# Patient Record
Sex: Male | Born: 1950
Health system: Southern US, Community
[De-identification: ages and names within clinical notes are randomized; demographics above are authoritative.]

## PROBLEM LIST (undated history)

## (undated) DIAGNOSIS — J449 Chronic obstructive pulmonary disease, unspecified: Secondary | ICD-10-CM

## (undated) DIAGNOSIS — I83812 Varicose veins of left lower extremities with pain: Secondary | ICD-10-CM

## (undated) DIAGNOSIS — R0989 Other specified symptoms and signs involving the circulatory and respiratory systems: Secondary | ICD-10-CM

## (undated) DIAGNOSIS — F17200 Nicotine dependence, unspecified, uncomplicated: Secondary | ICD-10-CM

## (undated) DIAGNOSIS — E7800 Pure hypercholesterolemia, unspecified: Secondary | ICD-10-CM

## (undated) DIAGNOSIS — Z1211 Encounter for screening for malignant neoplasm of colon: Secondary | ICD-10-CM

## (undated) DIAGNOSIS — H9190 Unspecified hearing loss, unspecified ear: Secondary | ICD-10-CM

## (undated) DIAGNOSIS — Z8619 Personal history of other infectious and parasitic diseases: Secondary | ICD-10-CM

## (undated) DIAGNOSIS — E039 Hypothyroidism, unspecified: Secondary | ICD-10-CM

## (undated) DIAGNOSIS — L219 Seborrheic dermatitis, unspecified: Secondary | ICD-10-CM

## (undated) HISTORY — DX: Pure hypercholesterolemia, unspecified: E78.00

## (undated) HISTORY — DX: Nicotine dependence, unspecified, uncomplicated: F17.200

## (undated) HISTORY — DX: Other specified symptoms and signs involving the circulatory and respiratory systems: R09.89

## (undated) HISTORY — PX: EXTERNAL EAR SURGERY: SHX627

## (undated) HISTORY — DX: Hypothyroidism, unspecified: E03.9

## (undated) HISTORY — DX: Unspecified hearing loss, unspecified ear: H91.90

## (undated) HISTORY — DX: Personal history of other infectious and parasitic diseases: Z86.19

## (undated) HISTORY — DX: Encounter for screening for malignant neoplasm of colon: Z12.11

## (undated) HISTORY — PX: TONSILLECTOMY: SUR1361

## (undated) HISTORY — DX: Varicose veins of left lower extremity with pain: I83.812

## (undated) HISTORY — DX: Chronic obstructive pulmonary disease, unspecified: J44.9

## (undated) HISTORY — DX: Seborrheic dermatitis, unspecified: L21.9

---

## 2002-11-29 ENCOUNTER — Encounter: Payer: Self-pay | Admitting: Emergency Medicine

## 2002-11-29 ENCOUNTER — Emergency Department (HOSPITAL_COMMUNITY): Admission: EM | Admit: 2002-11-29 | Discharge: 2002-11-30 | Payer: Self-pay | Admitting: Emergency Medicine

## 2004-05-17 ENCOUNTER — Ambulatory Visit: Payer: Self-pay | Admitting: Pulmonary Disease

## 2005-10-02 ENCOUNTER — Ambulatory Visit: Payer: Self-pay | Admitting: Pulmonary Disease

## 2006-11-28 ENCOUNTER — Ambulatory Visit: Payer: Self-pay | Admitting: Pulmonary Disease

## 2006-11-28 LAB — CONVERTED CEMR LAB
AST: 15 units/L (ref 0–37)
Albumin: 4.1 g/dL (ref 3.5–5.2)
Basophils Relative: 0.8 % (ref 0.0–1.0)
CO2: 30 meq/L (ref 19–32)
Chloride: 105 meq/L (ref 96–112)
Cholesterol: 198 mg/dL (ref 0–200)
Creatinine, Ser: 0.8 mg/dL (ref 0.4–1.5)
Eosinophils Relative: 1.8 % (ref 0.0–5.0)
Glucose, Bld: 97 mg/dL (ref 70–99)
HCT: 44.9 % (ref 39.0–52.0)
Hemoglobin: 15.3 g/dL (ref 13.0–17.0)
LDL Cholesterol: 147 mg/dL — ABNORMAL HIGH (ref 0–99)
MCHC: 34.2 g/dL (ref 30.0–36.0)
Monocytes Absolute: 0.6 10*3/uL (ref 0.2–0.7)
Neutrophils Relative %: 62.1 % (ref 43.0–77.0)
PSA: 0.71 ng/mL (ref 0.10–4.00)
Potassium: 4.6 meq/L (ref 3.5–5.1)
RBC: 5.12 M/uL (ref 4.22–5.81)
RDW: 12.1 % (ref 11.5–14.6)
Sodium: 140 meq/L (ref 135–145)
TSH: 3.04 microintl units/mL (ref 0.35–5.50)
Total Bilirubin: 0.8 mg/dL (ref 0.3–1.2)
Total CHOL/HDL Ratio: 4.8
Total Protein: 7.5 g/dL (ref 6.0–8.3)
VLDL: 9 mg/dL (ref 0–40)
WBC: 8.7 10*3/uL (ref 4.5–10.5)

## 2007-12-15 ENCOUNTER — Encounter: Payer: Self-pay | Admitting: Pulmonary Disease

## 2008-03-10 ENCOUNTER — Ambulatory Visit: Payer: Self-pay | Admitting: Pulmonary Disease

## 2008-03-10 DIAGNOSIS — F17200 Nicotine dependence, unspecified, uncomplicated: Secondary | ICD-10-CM | POA: Insufficient documentation

## 2008-03-10 DIAGNOSIS — E039 Hypothyroidism, unspecified: Secondary | ICD-10-CM | POA: Insufficient documentation

## 2008-03-10 DIAGNOSIS — J449 Chronic obstructive pulmonary disease, unspecified: Secondary | ICD-10-CM | POA: Insufficient documentation

## 2008-03-10 DIAGNOSIS — J4489 Other specified chronic obstructive pulmonary disease: Secondary | ICD-10-CM | POA: Insufficient documentation

## 2008-03-10 DIAGNOSIS — K4090 Unilateral inguinal hernia, without obstruction or gangrene, not specified as recurrent: Secondary | ICD-10-CM | POA: Insufficient documentation

## 2008-03-10 DIAGNOSIS — L219 Seborrheic dermatitis, unspecified: Secondary | ICD-10-CM | POA: Insufficient documentation

## 2008-03-12 DIAGNOSIS — H9190 Unspecified hearing loss, unspecified ear: Secondary | ICD-10-CM | POA: Insufficient documentation

## 2008-03-12 DIAGNOSIS — K029 Dental caries, unspecified: Secondary | ICD-10-CM | POA: Insufficient documentation

## 2008-03-12 DIAGNOSIS — I868 Varicose veins of other specified sites: Secondary | ICD-10-CM | POA: Insufficient documentation

## 2008-03-12 DIAGNOSIS — E7800 Pure hypercholesterolemia, unspecified: Secondary | ICD-10-CM | POA: Insufficient documentation

## 2008-03-15 ENCOUNTER — Ambulatory Visit: Payer: Self-pay | Admitting: Pulmonary Disease

## 2008-03-17 LAB — CONVERTED CEMR LAB
ALT: 20 units/L (ref 0–53)
AST: 20 units/L (ref 0–37)
Albumin: 4.2 g/dL (ref 3.5–5.2)
Alkaline Phosphatase: 102 units/L (ref 39–117)
BUN: 10 mg/dL (ref 6–23)
Basophils Relative: 0.3 % (ref 0.0–3.0)
CO2: 30 meq/L (ref 19–32)
Chloride: 100 meq/L (ref 96–112)
Creatinine, Ser: 0.9 mg/dL (ref 0.4–1.5)
Eosinophils Relative: 1.8 % (ref 0.0–5.0)
Glucose, Bld: 83 mg/dL (ref 70–99)
HCT: 47.1 % (ref 39.0–52.0)
HDL: 38.3 mg/dL — ABNORMAL LOW (ref >39.0)
Hemoglobin, Urine: NEGATIVE
Monocytes Relative: 7.3 % (ref 3.0–12.0)
Neutrophils Relative %: 74 % (ref 43.0–77.0)
Nitrite: NEGATIVE
Platelets: 202 10*3/uL (ref 150–400)
Potassium: 4.6 meq/L (ref 3.5–5.1)
RBC: 5.27 M/uL (ref 4.22–5.81)
Specific Gravity, Urine: 1.01 (ref 1.000–1.03)
Total Protein, Urine: NEGATIVE mg/dL
Total Protein: 7.5 g/dL (ref 6.0–8.3)
Urine Glucose: NEGATIVE mg/dL
Urobilinogen, UA: 0.2 (ref 0.0–1.0)
WBC: 13.5 10*3/uL — ABNORMAL HIGH (ref 4.5–10.5)

## 2009-06-29 ENCOUNTER — Telehealth: Payer: Self-pay | Admitting: Pulmonary Disease

## 2009-07-17 ENCOUNTER — Telehealth: Payer: Self-pay | Admitting: Pulmonary Disease

## 2009-09-12 ENCOUNTER — Ambulatory Visit: Payer: Self-pay | Admitting: Pulmonary Disease

## 2009-09-12 LAB — CONVERTED CEMR LAB
ALT: 13 units/L (ref 0–53)
Alkaline Phosphatase: 95 units/L (ref 39–117)
Basophils Relative: 0.9 % (ref 0.0–3.0)
Bilirubin Urine: NEGATIVE
Bilirubin, Direct: 0.1 mg/dL (ref 0.0–0.3)
Chloride: 101 meq/L (ref 96–112)
Cholesterol: 205 mg/dL — ABNORMAL HIGH (ref 0–200)
Creatinine, Ser: 0.9 mg/dL (ref 0.4–1.5)
Eosinophils Absolute: 0.2 10*3/uL (ref 0.0–0.7)
Ketones, ur: NEGATIVE mg/dL
MCHC: 33.5 g/dL (ref 30.0–36.0)
MCV: 91.3 fL (ref 78.0–100.0)
Monocytes Absolute: 0.7 10*3/uL (ref 0.1–1.0)
Neutro Abs: 6.5 10*3/uL (ref 1.4–7.7)
Neutrophils Relative %: 66.8 % (ref 43.0–77.0)
PSA: 0.92 ng/mL (ref 0.10–4.00)
Potassium: 3.8 meq/L (ref 3.5–5.1)
RBC: 5.03 M/uL (ref 4.22–5.81)
Sodium: 141 meq/L (ref 135–145)
Total Bilirubin: 0.6 mg/dL (ref 0.3–1.2)
Total CHOL/HDL Ratio: 4
Total Protein, Urine: NEGATIVE mg/dL
VLDL: 15.2 mg/dL (ref 0.0–40.0)
pH: 6.5 (ref 5.0–8.0)

## 2010-07-17 NOTE — Assessment & Plan Note (Signed)
Summary: rov/jd   CC:  18 month ROV & CPX....  History of Present Illness: 60 y/o WM here for a follow up visit and CPX...    ~  Sep09:  he notes that he has been having dental problems and getting some work done- he requests an antibiotic to keep on hand "just in case"... he is allergic to Amox w/ rash- OK Biaxin per request.   ~  September 12, 2009:  here for CPX- doing well in general but he is disappointed in himself for continued smoking  ~1ppd;  never filled the Chantix after reading the Pharm information "that stuff can kill you";  want's to try "the patch"...  we prev rec Statin Rx for his Chol but he read that Pharm hand-out too and decided against med Rx due to potentialside effects (of course I reminded him of the known "side effects" of elevated chol & coninued smoking)... he never scheduled his needed colon screen either... he did get the much needed dental work...   Current Problem List:  PHYSICAL EXAMINATION (ICD-V70.0) - he has never had a routine screening colonoscopy and we will refer him to GI for this screening procedure...  DENTAL CARIES (ICD-521.00) - as above... he had extensive dental work done & he is a much better situation now...  UNSPECIFIED HEARING LOSS (ICD-389.9) - hx of nail gun injury behind left ear that left him deaf in that ear...  CIGARETTE SMOKER (ICD-305.1) - still smoking 1ppd... we discussed strategies for smoking cessation.  COPD (ICD-496) - he denies symptoms- denies cough, sputum, hemoptysis, worsening dyspnea,  wheezing, chest pains, snoring, daytime hypersomnolence, etc... he also declined prev baseline PFT's... CXR's have shown mild peribronchial thickening/ COPD...   ~  f/u CXR 3/11 showed clear lungs, NAD.Marland KitchenMarland Kitchen  VARICOSE VEIN (ICD-456.8) - he has VV on left leg and wears support hose...  HYPERCHOLESTEROLEMIA (ICD-272.0) - on diet alone...  ~  FLP 6/08 showed TChol 198, TG 47, HDL 42, LDL 147... rec> better diet, consider low dose Statin.  ~  FLP  9/09 showed TChol 227, TG 94, HDL 38, LDL 181... rec> start Prav40 ($4 list)- he never tried it!  ~  FLP 3/11 showed TChol 205, TG 76, HDL 46, LDL 152... he prefers diet Rx, no meds.  HYPOTHYROIDISM (ICD-244.9) - stable on SYNTHROID .149mcg/d...   ~  labs 6/08 showed TSH= 3.04  ~  labs 9/09 showed TSH= 3.16  ~  labs 3/11 showed TSH= 4.09  INGUINAL HERNIA (ICD-550.90) - he has bilat inguinal hernias (R>L) & wears a truss... surg eval 2007 by [facility] who rec surgical repair but pt has so far declined intervention and prefers to continue wearing the truss... hernias are easily reducible...  ~  3/11:  hernias w/o change- he will consider surg at [facility].  DERMATITIS, SEBORRHEIC (ICD-690.10) - he uses LOTRISONE cream Prn...   Allergies: 1)  ! Penicillin  Comments:  Nurse/Medical Assistant: The patient's medications and allergies were reviewed with the patient and were updated in the Medication and Allergy Lists.  Past History:  Past Medical History:  DENTAL CARIES (ICD-521.00) UNSPECIFIED HEARING LOSS (ICD-389.9) CIGARETTE SMOKER (ICD-305.1) COPD (ICD-496) VARICOSE VEIN (ICD-456.8) HYPERCHOLESTEROLEMIA (ICD-272.0) HYPOTHYROIDISM (ICD-244.9) INGUINAL HERNIA (ICD-550.90) DERMATITIS, SEBORRHEIC (ICD-690.10)  Family History: Reviewed history from 03/10/2008 and no changes required. Father alive age 81 w/ thyroid condition and bipolar illness Mother alive age 59 w/ CLL 2 Sibling:  1 Bro w/ cerebral aneurysm surg,  1 Sis a/w  Social History: Reviewed history from  03/10/2008 and no changes required. 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 no Etoh  Review of Systems  The patient denies fever, chills, sweats, anorexia, fatigue, weakness, malaise, weight loss, sleep disorder, blurring, diplopia, eye irritation, eye discharge, vision loss, eye pain, photophobia, earache, ear discharge, tinnitus, decreased  hearing, nasal congestion, nosebleeds, sore throat, hoarseness, chest pain, palpitations, syncope, dyspnea on exertion, orthopnea, PND, peripheral edema, cough, dyspnea at rest, excessive sputum, hemoptysis, wheezing, pleurisy, nausea, vomiting, diarrhea, constipation, change in bowel habits, abdominal pain, melena, hematochezia, jaundice, gas/bloating, indigestion/heartburn, dysphagia, odynophagia, dysuria, hematuria, urinary frequency, urinary hesitancy, nocturia, incontinence, back pain, joint pain, joint swelling, muscle cramps, muscle weakness, stiffness, arthritis, sciatica, restless legs, leg pain at night, leg pain with exertion, rash, itching, dryness, suspicious lesions, paralysis, paresthesias, seizures, tremors, vertigo, transient blindness, frequent falls, frequent headaches, difficulty walking, depression, anxiety, memory loss, confusion, cold intolerance, heat intolerance, polydipsia, polyphagia, polyuria, unusual weight change, abnormal bruising, bleeding, enlarged lymph nodes, urticaria, allergic rash, hay fever, and recurrent infections.    Vital Signs:  Patient profile:   60 year old male Height:      73 inches Weight:      185 pounds BMI:     24.50 O2 Sat:      99 % on Room air Temp:     97.0 degrees F oral Pulse rate:   58 / minute BP sitting:   134 / 86  (left arm) Cuff size:   regular  Vitals Entered By: Randell Loop CMA (September 12, 2009 11:36 AM)  O2 Sat at Rest %:  99 O2 Flow:  Room air CC: 18 month ROV & CPX... Is Patient Diabetic? No Pain Assessment Patient in pain? no      Comments meds updated today   Physical Exam  Additional Exam:  WD, WN, 60 y/o WM in NAD... GENERAL:  Alert & oriented; pleasant & cooperative... HEENT:  [address]/AT, EOM-wnl, PERRLA, Fundi-benign, EACs-clear, TMs-wnl, NOSE-clear, THROAT-clear & wnl, TEETH-poor repair. NECK:  Supple w/ fairROM; no JVD; normal carotid impulses w/o bruits; no thyromegaly or nodules palpated; no  lymphadenopathy. CHEST:  Clear to P & A; without wheezes/ rales/ or rhonchi heard... HEART:  Regular Rhythm; without murmurs/ rubs/ or gallops detected... ABDOMEN:  Soft & nontender; normal bowel sounds; no organomegaly or masses palpated... RECTAL:  Neg - prostate 2+ & nontender w/o nodules; stool hematest neg. EXT: without deformities or arthritic changes; +varicose veins on left leg/ +venous insuffic/ no edema. NEURO:  CN's intact; motor testing normal; sensory testing normal; gait normal & balance OK. DERM:  No lesions noted; no rash etc...    CXR  Procedure date:  09/12/2009  Findings:      CHEST - 2 VIEW Comparison: Chest x-ray of 03/10/2008   Findings: The lungs are clear and slightly hyperaerated. Mediastinal contours are stable.  The heart is within upper limits of normal.  No acute bony abnormality is seen.   IMPRESSION: Stable chest x-ray with slight hyperaeration.  No active lung disease.   Read By:  Juline Patch,  M.D.    MISC. Report  Procedure date:  09/12/2009  Findings:      Lipid Panel (LIPID)   Cholesterol          [H]  205 mg/dL                   0-454   Triglycerides  76.0 mg/dL                  0.4-540.9   HDL                       81.19 mg/dL                 >14.78 Cholesterol LDL - Direct                             151.8 mg/dL  BMP (METABOL)   Sodium                    141 mEq/L                   135-145   Potassium                 3.8 mEq/L                   3.5-5.1   Chloride                  101 mEq/L                   96-112   Carbon Dioxide            32 mEq/L                    19-32   Glucose                   86 mg/dL                    29-56   BUN                       10 mg/dL                    2-13   Creatinine                0.9 mg/dL                   0.8-6.5   Calcium                   9.3 mg/dL                   7.8-46.9   GFR                       92.39 mL/min                >60  Hepatic/Liver Function Panel  (HEPATIC)   Total Bilirubin           0.6 mg/dL                   6.2-9.5   Direct Bilirubin          0.1 mg/dL                   2.8-4.1   Alkaline Phosphatase      95 U/L                      39-117   AST  16 U/L                      0-37   ALT                       13 U/L                      0-53   Total Protein             8.1 g/dL                    1.6-1.0   Albumin                   4.2 g/dL                    9.6-0.4  Comments:      CBC Platelet w/Diff (CBCD)   White Cell Count          9.7 K/uL                    4.5-10.5   Red Cell Count            5.03 Mil/uL                 4.22-5.81   Hemoglobin                15.4 g/dL                   54.0-98.1   Hematocrit                45.9 %                      39.0-52.0   MCV                       91.3 fl                     78.0-100.0   Platelet Count            198.0 K/uL                  150.0-400.0   Neutrophil %              66.8 %                      43.0-77.0   Lymphocyte %              23.1 %                      12.0-46.0   Monocyte %                7.3 %                       3.0-12.0   Eosinophils%              1.9 %                       0.0-5.0   Basophils %               0.9 %  0.0-3.0  TSH (TSH)   FastTSH                   4.09 uIU/mL                 0.35-5.50   Prostate Specific Antigen (PSA)   PSA-Hyb                   0.92 ng/mL                  0.10-4.00   UDip w/Micro (URINE)   Color                     LT. YELLOW   Clarity                   CLEAR                       Clear   Specific Gravity          <=1.005                     1.000 - 1.030   Urine Ph                  6.5                         5.0-8.0   Protein                   NEGATIVE                    Negative   Urine Glucose             NEGATIVE                    Negative   Ketones                   NEGATIVE                    Negative   Urine Bilirubin           NEGATIVE                     Negative   Blood                     NEGATIVE                    Negative   Urobilinogen              0.2                         0.0 - 1.0   Leukocyte Esterace        NEGATIVE                    Negative   Nitrite                   NEGATIVE                    Negative   Urine Epith               Rare(0-4/hpf)    Impression & Recommendations:  Problem # 1:  PHYSICAL  EXAMINATION (ICD-V70.0) He desperately needs to quit smoking, needs to improve his Chol, and needs screening colon... discussed in detail w/ pt... Orders: T-2 View CXR (71020TC) TLB-Lipid Panel (80061-LIPID) TLB-BMP (Basic Metabolic Panel-BMET) (80048-METABOL) TLB-Hepatic/Liver Function Pnl (80076-HEPATIC) TLB-CBC Platelet - w/Differential (85025-CBCD) TLB-TSH (Thyroid Stimulating Hormone) (84443-TSH) TLB-PSA (Prostate Specific Antigen) (84153-PSA) TLB-Udip w/ Micro (81001-URINE) Gastroenterology Referral (GI)  Problem # 2:  COPD (ICD-496) He is asymptomatic but needs to quit smoking... discussed smoking cessation strategies including cessation programs, counselling, nicotine replacement, and Chantix receptor blockade... the pt is not interested at this time but we left the door open should she like to reconsider at any time.   Problem # 3:  HYPERCHOLESTEROLEMIA (ICD-272.0) His LDL is sl better than 2009 but stiil >100 and needs improvement... he prefers diet + exercise & no new meds. The following medications were removed from the medication list:    Pravastatin Sodium 40 Mg Tabs (Pravastatin sodium) .[name]... Take one tablet by mouth at bedtime  Problem # 4:  HYPOTHYROIDISM (ICD-244.9) Stable on this dose of Synthroid... His updated medication list for this problem includes:    Levoxyl 137 Mcg Tabs (Levothyroxine sodium) .[name]... Take 1 tablet by mouth once a day  Problem # 5:  INGUINAL HERNIA (ICD-550.90) He has bilat inguinal hernias & in need of elective repair... offered to set up appt but he wanted to do this on his  own...  Problem # 6:  OTHER MEDICAL PROBLEMS AS NOTED>>>  Complete Medication List: 1)  Levoxyl 137 Mcg Tabs (Levothyroxine sodium) .... Take 1 tablet by mouth once a day 2)  Clotrimazole-betamethasone 1-0.05 % Crea (Clotrimazole-betamethasone) .... Apply to rash two times a day... 3)  Nicoderm Cq 21 Mg/24hr Pt24 (Nicotine) .... Apply one patch daily as directed...  Other Orders: Prescription Created Electronically [phone])  Patient Instructions: 1)  Today we updated your med list- see below.... 2)  We refilled your meds today... 3)  We also did your follow up CXR & fasting blood work... please call the "phone tree" in a few days for your lab results.Marland KitchenMarland Kitchen 4)  Samaj, you need to quit smoking!!! 5)  We will make a referral to our Gastroenterology Dept regarding the need for a screening colonoscopy... 6)  Call for any problems.Marland KitchenMarland Kitchen 7)  Please schedule a follow-up appointment in 1 year. Prescriptions: NICODERM CQ 21 MG/24HR PT24 (NICOTINE) apply one patch daily as directed...  #14 x 0   Entered and Authorized by:   Michele Mcalpine MD   Signed by:   Michele Mcalpine MD on 09/12/2009   Method used:   Print then Give to Patient   RxID:   [id number] CLOTRIMAZOLE-BETAMETHASONE 1-0.05 % CREA (CLOTRIMAZOLE-BETAMETHASONE) apply to rash two times a day...  #1 tube x prn   Entered and Authorized by:   Michele Mcalpine MD   Signed by:   Michele Mcalpine MD on 09/12/2009   Method used:   Print then Give to Patient   RxID:   [id number] LEVOXYL 137 MCG TABS (LEVOTHYROXINE SODIUM) Take 1 tablet by mouth once a day  #90 x prn   Entered and Authorized by:   Michele Mcalpine MD   Signed by:   Michele Mcalpine MD on 09/12/2009   Method used:   Print then Give to Patient   RxID:   [id number]

## 2010-07-17 NOTE — Progress Notes (Signed)
Summary: rx  Phone Note Call from Patient Call back at [phone]   Caller: Patient Call For: Joseph Meyers Reason for Call: Talk to Nurse Summary of Call: cold x 1 week,  chest congestion,  wants to get this knocked out   Can he get something called in for this? [facility] [place] Initial call taken by: Eugene Gavia,  June 29, 2009 2:20 PM  Follow-up for Phone Call        per SN---ok for pt to have zpak #1  and use mucinex max 1 by mouth two times a day with plenty of fluids.   [facility] into [facility] [place]  [phone].   pt is aware of these med recs per SN. Randell Loop CMA  June 29, 2009 4:16 PM

## 2010-07-17 NOTE — Progress Notes (Signed)
Summary: refill  Phone Note Call from Patient Call back at [phone]   Caller: Patient Call For: Mataeo Ingwersen Summary of Call: Pt needs refill on levoxyl, he has an appt on 3/29. Initial call taken by: Darletta Moll,  July 17, 2009 3:42 PM  Follow-up for Phone Call        meds called into pharmacy---[phone]  [facility] [place], [place] per pt----sent in #60 to last pt until his appt in march--pt is aware--the pharmcy listed in the refill request is not the correct pharmacy Randell Loop CMA  July 17, 2009 3:47 PM     Prescriptions: LEVOXYL 137 MCG TABS (LEVOTHYROXINE SODIUM) Take 1 tablet by mouth once a day  #60 x 0   Entered by:   Randell Loop CMA   Authorized by:   Michele Mcalpine MD   Signed by:   Randell Loop CMA on 07/17/2009   Method used:   Telephoned to ...       [facility] (retail)       [address]       [place], [place]  [address]       Ph: [phone]       Fax: [phone]   RxID:   [id number]

## 2010-11-07 ENCOUNTER — Telehealth: Payer: Self-pay | Admitting: Pulmonary Disease

## 2010-11-07 MED ORDER — LEVOTHYROXINE SODIUM 137 MCG PO TABS: 137.0000 ug | ORAL_TABLET | Freq: Every day | ORAL | Status: DC

## 2010-11-07 NOTE — Telephone Encounter (Signed)
Called, spoke with pt.  He is reqeusting levoxyl rx for 30 days supply to [facility] in [place].  Aware rx sent but instructed to keep scheduled appt in June for additional rxs.  Pt verbalized understanding of this.

## 2010-12-10 ENCOUNTER — Encounter: Payer: Self-pay | Admitting: Pulmonary Disease

## 2010-12-11 ENCOUNTER — Encounter: Payer: Self-pay | Admitting: Pulmonary Disease

## 2010-12-11 ENCOUNTER — Ambulatory Visit (INDEPENDENT_AMBULATORY_CARE_PROVIDER_SITE_OTHER): Payer: Managed Care, Other (non HMO) | Admitting: Pulmonary Disease

## 2010-12-11 VITALS — BP 128/84 | HR 82 | Temp 98.3°F | Resp 16 | Ht 73.0 in | Wt 185.0 lb

## 2010-12-11 DIAGNOSIS — F17200 Nicotine dependence, unspecified, uncomplicated: Secondary | ICD-10-CM

## 2010-12-11 DIAGNOSIS — E039 Hypothyroidism, unspecified: Secondary | ICD-10-CM

## 2010-12-11 DIAGNOSIS — I868 Varicose veins of other specified sites: Secondary | ICD-10-CM

## 2010-12-11 DIAGNOSIS — J449 Chronic obstructive pulmonary disease, unspecified: Secondary | ICD-10-CM

## 2010-12-11 DIAGNOSIS — E7800 Pure hypercholesterolemia, unspecified: Secondary | ICD-10-CM

## 2010-12-11 DIAGNOSIS — K4090 Unilateral inguinal hernia, without obstruction or gangrene, not specified as recurrent: Secondary | ICD-10-CM

## 2010-12-11 DIAGNOSIS — Z0000 Encounter for general adult medical examination without abnormal findings: Secondary | ICD-10-CM

## 2010-12-11 MED ORDER — LEVOTHYROXINE SODIUM 137 MCG PO TABS: 137.0000 ug | ORAL_TABLET | Freq: Every day | ORAL | Status: DC

## 2010-12-11 NOTE — Patient Instructions (Signed)
Today we updated your med list in our EPIC system>    We refilled your Synthroid Rx...  Please return to our lab one morning soon for your follow up fasting blood work...    Then please call the PHONE TREE in a few days for your results...    Dial [id number] & when prompted enter your patient number followed by the # symbol...    Your patient number is:  [id number]#  Joseph Meyers, you need to quit the smoking! And you need to set up a routine colonoscopy w/ Dr. Lina Sar...  Call for any questions...  Let's plan a follow up appt in one year, sooner if needed for problems.Marland KitchenMarland Kitchen

## 2010-12-11 NOTE — Progress Notes (Signed)
Subjective:    Patient ID: Joseph Meyers, male    DOB: Mar 07, 1951, 60 y.o.   MRN: [id number]  HPI 59 y/o WM here for a follow up visit and CPX...   ~  Sep09:  he notes that he has been having dental problems and getting some work done- he requests an antibiotic to keep on hand "just in case"... he is allergic to Amox w/ rash- OK Biaxin per request.  ~  September 12, 2009:  here for CPX- doing well in general but he is disappointed in himself for continued smoking ~1ppd;  never filled the Chantix after reading the Pharm information "that stuff can kill you";  want's to try "the patch"...  we prev rec Statin Rx for his Chol but he read that Pharm hand-out too and decided against med Rx due to potentialside effects (of course I reminded him of the known "side effects" of elevated chol & coninued smoking)... he never scheduled his needed colon screen either... he did get the much needed dental work...  ~  December 11, 2010:  96mo ROV & Zeus is feeling well, still working delivering auto parts, & he has no new complaints or concerns... His only prescription med is the Synthroid which he takes every day;  He still smokes 1ppd w/ mild AM cough & phlegm- denies CP, palpit, SOB, hemoptysis, etc;  He wants to try the patch again & advised this is avail whenever he wants OTC;  He tells me his sister is trying to convince him to get his needed screening colonoscopy & he promises to call DrDBrodie when he is ready;  He still has bilat inguinal hernias & wears a truss- given the name & number for DrHIngram at [facility]...  [place] is not fasting today & will ret for his fasting blood work..   Problem List:  PHYSICAL EXAMINATION (ICD-V70.0) - he has never had a routine screening colonoscopy and we will refer him to GI for this screening procedure; he indicates he would like to see DrDBrodie & we will make a referral for him...  DENTAL CARIES (ICD-521.00) - as above... he had extensive dental work done, now has dentures, & he is  a much improved...  UNSPECIFIED HEARING LOSS (ICD-389.9) - hx of nail gun injury behind left ear that left him deaf in that ear...  CIGARETTE SMOKER (ICD-305.1) - still smoking 1ppd... we discussed strategies for smoking cessation but he refuses Chantix after reading the Pharm hand-out, and he has prev failed the nicotine patches...  COPD (ICD-496) - he denies symptoms other than mild AM cough/ phlegm; specifically denies hemoptysis, worsening dyspnea,  wheezing, chest pains, snoring, daytime hypersomnolence, etc... he also declined prev baseline PFT's... CXR's have shown mild peribronchial thickening/ COPD...  ~  f/u CXR 3/11 showed clear lungs, NAD.Marland KitchenMarland Kitchen  VARICOSE VEIN (ICD-456.8) - he has VV on left leg and wears support hose...  HYPERCHOLESTEROLEMIA (ICD-272.0) - on diet alone... ~  FLP 6/08 showed TChol 198, TG 47, HDL 42, LDL 147... rec> better diet, consider low dose Statin. ~  FLP 9/09 showed TChol 227, TG 94, HDL 38, LDL 181... rec> start Prav40 ($4 list)- he never tried it! ~  FLP 3/11 showed TChol 205, TG 76, HDL 46, LDL 152... he prefers diet Rx, no meds. ~  FLP 7/12 showed ==> pending  HYPOTHYROIDISM (ICD-244.9) - stable on SYNTHROID .123mcg/d...  ~  labs 6/08 showed TSH= 3.04 ~  labs 9/09 showed TSH= 3.16 ~  labs 3/11 showed TSH= 4.09 ~  Labs 7/12 showed ==> pending  INGUINAL HERNIA (ICD-550.90) - he has bilat inguinal hernias (R>L) & wears a truss... surg eval 2007 by [id number] who rec surgical repair but pt has so far declined intervention and prefers to continue wearing the truss... hernias are easily reducible... ~  3/11:  hernias w/o change- he will consider surg at [facility]. ~  6/12:  Hernias persist w/o change, I have again advised surg & gave him the name of DrHIngram at [facility]...  DERMATITIS, SEBORRHEIC (ICD-690.10) - he uses LOTRISONE cream Prn...   No past surgical history on file.   Outpatient Encounter Prescriptions as of 12/11/2010  Medication Sig Dispense Refill  .  clotrimazole-betamethasone (LOTRISONE) cream Apply topically 2 (two) times daily.        [name] levothyroxine (SYNTHROID, LEVOTHROID) 137 MCG tablet Take 137 mcg by mouth daily.  30 tablet  0  . nicotine (NICODERM CQ - DOSED IN MG/24 HOURS) 21 mg/24hr patch Place 1 patch onto the skin daily.          Allergies  Allergen Reactions  . Penicillins     REACTION: rash    Review of Systems    The patient denies fever, chills, sweats, anorexia, fatigue, weakness, malaise, weight loss, sleep disorder, blurring, diplopia, eye irritation, eye discharge, vision loss, eye pain, photophobia, earache, ear discharge, tinnitus, decreased hearing, nasal congestion, nosebleeds, sore throat, hoarseness, chest pain, palpitations, syncope, dyspnea on exertion, orthopnea, PND, peripheral edema, cough, dyspnea at rest, excessive sputum, hemoptysis, wheezing, pleurisy, nausea, vomiting, diarrhea, constipation, change in bowel habits, abdominal pain, melena, hematochezia, jaundice, gas/bloating, indigestion/heartburn, dysphagia, odynophagia, dysuria, hematuria, urinary frequency, urinary hesitancy, nocturia, incontinence, back pain, joint pain, joint swelling, muscle cramps, muscle weakness, stiffness, arthritis, sciatica, restless legs, leg pain at night, leg pain with exertion, rash, itching, dryness, suspicious lesions, paralysis, paresthesias, seizures, tremors, vertigo, transient blindness, frequent falls, frequent headaches, difficulty walking, depression, anxiety, memory loss, confusion, cold intolerance, heat intolerance, polydipsia, polyphagia, polyuria, unusual weight change, abnormal bruising, bleeding, enlarged lymph nodes, urticaria, allergic rash, hay fever, and recurrent infections.     Objective:   Physical Exam      WD, WN, 60 y/o WM in NAD... GENERAL:  Alert & oriented; pleasant & cooperative... HEENT:  [place]/AT, EOM-wnl, PERRLA, Fundi-benign, EACs-clear, TMs-wnl, NOSE-clear, THROAT-clear & wnl, TEETH-poor  repair. NECK:  Supple w/ fairROM; no JVD; normal carotid impulses w/o bruits; no thyromegaly or nodules palpated; no lymphadenopathy. CHEST:  Clear to P & A; without wheezes/ rales/ or rhonchi heard... HEART:  Regular Rhythm; without murmurs/ rubs/ or gallops detected... ABDOMEN:  Soft & nontender; normal bowel sounds; no organomegaly or masses palpated... (RECTAL:  Neg - prostate 2+ & nontender w/o nodules; stool hematest neg) EXT: without deformities or arthritic changes; +varicose veins on left leg/ +venous insuffic/ no edema. NEURO:  CN's intact; motor testing normal; sensory testing normal; gait normal & balance OK. DERM:  No lesions noted; no rash etc...   Assessment & Plan:   SMOKER/ mild chronic bronchitis>  We discussed smoking cessation; he will try the patch again; discussed MucinexDM as needed...  VV/ Ven Insuffic>  We discussed no salt, elevation, support hose...  CHOL>  He has refused rec for statin therapy & is content to continue diet alone; f/u FLP is pending...  Hypothyroid>  He feels much better on the Levothy 138mcg/d; f/u TSH is pending...  Bilat Inguinal Hernias>  He wears a truss & is content to continue along like this; I have advised surg consult w/  DrHIngram at [facility]...  GI>  Need for screening colonoscopy; he notes his sister is working on him to get this done; we will refer to DrDBrodie..  Other medical problems as noted.Marland KitchenMarland Kitchen

## 2011-01-01 ENCOUNTER — Other Ambulatory Visit: Payer: Self-pay | Admitting: Pulmonary Disease

## 2011-01-01 ENCOUNTER — Other Ambulatory Visit (INDEPENDENT_AMBULATORY_CARE_PROVIDER_SITE_OTHER): Payer: Managed Care, Other (non HMO)

## 2011-01-01 DIAGNOSIS — Z0000 Encounter for general adult medical examination without abnormal findings: Secondary | ICD-10-CM

## 2011-01-01 LAB — CBC WITH DIFFERENTIAL/PLATELET
Basophils Absolute: 0 10*3/uL (ref 0.0–0.1)
Eosinophils Relative: 2.3 % (ref 0.0–5.0)
Monocytes Relative: 8.3 % (ref 3.0–12.0)
Neutrophils Relative %: 63.9 % (ref 43.0–77.0)
Platelets: 181 10*3/uL (ref 150.0–400.0)
WBC: 9.6 10*3/uL (ref 4.5–10.5)

## 2011-01-01 LAB — BASIC METABOLIC PANEL
BUN: 21 mg/dL (ref 6–23)
CO2: 29 mEq/L (ref 19–32)
Chloride: 105 mEq/L (ref 96–112)
Glucose, Bld: 91 mg/dL (ref 70–99)
Potassium: 5.3 mEq/L — ABNORMAL HIGH (ref 3.5–5.1)

## 2011-01-01 LAB — HEPATIC FUNCTION PANEL
ALT: 16 U/L (ref 0–53)
AST: 16 U/L (ref 0–37)
Albumin: 4.4 g/dL (ref 3.5–5.2)
Total Protein: 7.4 g/dL (ref 6.0–8.3)

## 2011-01-01 LAB — TSH: TSH: 2.94 u[IU]/mL (ref 0.35–5.50)

## 2011-01-01 LAB — LIPID PANEL: HDL: 41.8 mg/dL (ref >39.00)

## 2011-01-01 LAB — PSA: PSA: 0.98 ng/mL (ref 0.10–4.00)

## 2011-01-04 ENCOUNTER — Telehealth: Payer: Self-pay | Admitting: Pulmonary Disease

## 2011-01-04 MED ORDER — PRAVASTATIN SODIUM 40 MG PO TABS: 40.0000 mg | ORAL_TABLET | Freq: Every evening | ORAL | Status: DC

## 2011-01-04 MED ORDER — CLOTRIMAZOLE-BETAMETHASONE 1-0.05 % EX CREA: TOPICAL_CREAM | Freq: Two times a day (BID) | CUTANEOUS | Status: DC

## 2011-01-04 NOTE — Telephone Encounter (Signed)
Called and spoke with pt about his lab results per SN---pt is aware that the lotrisone cream has been sent to the pharmacy.  He is aware of lab results and that per SN he will need chol meds--pravastatin 40mg  has been sent to the pts pharmacy and he is aware.  He will call back in oct/nov for repeat labs.

## 2011-01-12 ENCOUNTER — Telehealth: Payer: Self-pay | Admitting: Pulmonary Disease

## 2011-04-06 NOTE — Telephone Encounter (Signed)
Wife called: pharmacy closed, pt is out of Levoxyl >>> Levoxyl 137 mcg PO daily # 5 called to [facility] [phone]

## 2011-09-03 ENCOUNTER — Telehealth: Payer: Self-pay | Admitting: Pulmonary Disease

## 2011-09-03 NOTE — Telephone Encounter (Signed)
Encounter closed in error, will forward to [employer].

## 2011-09-03 NOTE — Telephone Encounter (Signed)
Per SN---pt will need to rest, increase fluids, tylenol 2 qid prn for the aches.  Called and spoke with pt and he is aware of SN recs and voiced his understanding of this . Pt is to call back on Friday if he is not feeling better.

## 2011-09-03 NOTE — Telephone Encounter (Signed)
Spoke with pt. He states feels like "has a bug". He states since yesterday am has "felt drained"- lack of appetite, fatigue, body aches, HA, slight dry cough, chills. He states he took 325 mg ASA and this helped a slight bit. He states that his breathing is fine- denies wheeze, CP, SOB, sore throat, fever, n/v/d, abd pain. I advised take tylenol for aches and encouraged plenty of fluids and rest. Will check with SN for any further recs. Please advise, thanks! Allergies  Allergen Reactions  . Penicillins     REACTION: rash

## 2011-10-23 ENCOUNTER — Other Ambulatory Visit: Payer: Self-pay | Admitting: *Deleted

## 2011-10-23 MED ORDER — SYNTHROID 137 MCG PO TABS: 137.0000 ug | ORAL_TABLET | Freq: Every day | ORAL | Status: DC

## 2011-10-23 NOTE — Telephone Encounter (Signed)
Received fax from pharmacy stating levoxyl is no longer covered and needed to send a new rx for synthroid 137 mcg instead and mark DAW. I have sent new rx and nothing further was needed

## 2012-06-16 ENCOUNTER — Telehealth: Payer: Self-pay | Admitting: Pulmonary Disease

## 2012-06-16 MED ORDER — SYNTHROID 137 MCG PO TABS: 137.0000 ug | ORAL_TABLET | Freq: Every day | ORAL | Status: DC

## 2012-06-16 NOTE — Telephone Encounter (Signed)
Pt aware that rx has been sent in for him.

## 2012-07-28 ENCOUNTER — Ambulatory Visit (INDEPENDENT_AMBULATORY_CARE_PROVIDER_SITE_OTHER)
Admission: RE | Admit: 2012-07-28 | Discharge: 2012-07-28 | Disposition: A | Discharge status: Home / Self Care | Payer: Managed Care, Other (non HMO) | Source: Ambulatory Visit | Attending: Pulmonary Disease | Admitting: Pulmonary Disease

## 2012-07-28 ENCOUNTER — Encounter: Payer: Self-pay | Admitting: Pulmonary Disease

## 2012-07-28 ENCOUNTER — Ambulatory Visit (INDEPENDENT_AMBULATORY_CARE_PROVIDER_SITE_OTHER): Payer: Managed Care, Other (non HMO) | Admitting: Pulmonary Disease

## 2012-07-28 ENCOUNTER — Encounter: Payer: Self-pay | Admitting: Internal Medicine

## 2012-07-28 VITALS — BP 156/90 | HR 77 | Temp 98.0°F | Ht 73.0 in | Wt 190.2 lb

## 2012-07-28 DIAGNOSIS — L219 Seborrheic dermatitis, unspecified: Secondary | ICD-10-CM

## 2012-07-28 DIAGNOSIS — Z0000 Encounter for general adult medical examination without abnormal findings: Secondary | ICD-10-CM

## 2012-07-28 DIAGNOSIS — E039 Hypothyroidism, unspecified: Secondary | ICD-10-CM

## 2012-07-28 DIAGNOSIS — I868 Varicose veins of other specified sites: Secondary | ICD-10-CM

## 2012-07-28 DIAGNOSIS — F17200 Nicotine dependence, unspecified, uncomplicated: Secondary | ICD-10-CM

## 2012-07-28 DIAGNOSIS — J4489 Other specified chronic obstructive pulmonary disease: Secondary | ICD-10-CM

## 2012-07-28 DIAGNOSIS — K4090 Unilateral inguinal hernia, without obstruction or gangrene, not specified as recurrent: Secondary | ICD-10-CM

## 2012-07-28 DIAGNOSIS — E7800 Pure hypercholesterolemia, unspecified: Secondary | ICD-10-CM

## 2012-07-28 DIAGNOSIS — J449 Chronic obstructive pulmonary disease, unspecified: Secondary | ICD-10-CM

## 2012-07-28 MED ORDER — SYNTHROID 137 MCG PO TABS: 137.0000 ug | ORAL_TABLET | Freq: Every day | ORAL | Status: DC

## 2012-07-28 MED ORDER — CLOTRIMAZOLE-BETAMETHASONE 1-0.05 % EX CREA: TOPICAL_CREAM | Freq: Two times a day (BID) | CUTANEOUS | Status: DC

## 2012-07-28 MED ORDER — PRAVASTATIN SODIUM 40 MG PO TABS: 40.0000 mg | ORAL_TABLET | Freq: Every evening | ORAL | Status: DC

## 2012-07-28 NOTE — Progress Notes (Addendum)
Subjective:    Patient ID: Joseph Meyers, male    DOB: 1951-04-20, 62 y.o.   MRN: [id number]  HPI 62 y/o WM here for a follow up visit and CPX...   ~  September 12, 2009:  here for CPX- doing well in general but he is disappointed in himself for continued smoking ~1ppd;  never filled the Chantix after reading the Pharm information "that stuff can kill you";  want's to try "the patch"...  we prev rec Statin Rx for his Chol but he read that Pharm hand-out too and decided against med Rx due to potential side effects (of course I reminded him of the known "side effects" of elevated chol & continued smoking)... he never scheduled his needed colon screen either... he did get the much needed dental work...  ~  December 11, 2010:  60mo ROV & Joseph Meyers is feeling well, still working delivering auto parts, & he has no new complaints or concerns... His only prescription med is the Synthroid which he takes every day;  He still smokes 1ppd w/ mild AM cough & phlegm- denies CP, palpit, SOB, hemoptysis, etc;  He wants to try the patch again & advised this is avail whenever he wants OTC;  He tells me his sister is trying to convince him to get his needed screening colonoscopy & he promises to call DrDBrodie when he is ready;  He still has bilat inguinal hernias & wears a truss- given the name & number for DrHIngram at [facility]...  [name] is not fasting today & will ret for his fasting blood work.  ~  July 28, 2012:  43mo ROV & Joseph Meyers & Joseph Meyers have separated but live about apart he says & they remain good friends;    Cig Smoker, mild COPD> still smoking ~1ppd & has no interest in quitting despite the damage it's done & the risks; he has mild AM cough & phlegm c/w chronic bronchitis (advised Mucinex- he declines inhalers)... VV> he has mod VV in the left leg & knows to elim sodium, wear support hose, etc...  Hypercholesterolemia> we prev prescribed Prav40 but he never took it; "I take one of the world's best antioxidants- coffee"  he says; f/u FLP => pending > he says he'll take the Prav40 now & wants Rx. Hypothyroid> on Synthroid137; he is clinically & biochem euthyroid; f/u TSH => pending Need for screening colonoscopy> he still hasn't scheduled the needed screening colonoscopy & advised to do so ASAP; we offered to sched for him but he decline4d preferring to make his own appt... Inguinal hernias> he is reminded to call [facility]- DrIngram for the needed surgery... Seb Dermatitis> he uses Lotrisone cream as needed...  We reviewed prob list, meds, xrays and labs> see below for updates >>  CXR 2/14 showed mild COPD changes, some scarring on the right & RLL granuloma, NAD... Addendum> LABS 2/14:  FLP- not at goal w/ LDL=151, start Prav40;  Chems- wnl;  CBC- wnl;  TSH=5.28;  PDA=0.92           Problem List:  PHYSICAL EXAMINATION (ICD-V70.0) - he has never had a routine screening colonoscopy and we will refer him to GI for this screening procedure; he indicates he would like to see DrDBrodie & we will make a referral for him...  DENTAL CARIES (ICD-521.00) - as above... he had extensive dental work done, now has dentures, & he is a much improved...  UNSPECIFIED HEARING LOSS (ICD-389.9) - hx of nail gun injury behind left  ear that left him deaf in that ear...  CIGARETTE SMOKER (ICD-305.1) - still smoking 1ppd... we discussed strategies for smoking cessation but he refuses Chantix after reading the Pharm hand-out, and he has prev failed the nicotine patches... ~  2/14: he is not interested in any additional; smoking cessation counseling...  COPD (ICD-496) - he denies symptoms other than mild AM cough/ phlegm (c/w mild chronic bronchitis); specifically denies hemoptysis, worsening dyspnea, wheezing, chest pains, snoring, daytime hypersomnolence, etc... he also declined prev baseline PFT's... CXR's have shown mild peribronchial thickening/ COPD...  ~  f/u CXR 3/11 showed clear lungs, NAD.[name]. ~  f/u CXR 2/14 showed mild COPD  changes, some scarring on the right & RLL granuloma, NAD.Marland KitchenMarland Kitchen  VARICOSE VEIN (ICD-456.8) - he has VV on left leg and wears support hose...  HYPERCHOLESTEROLEMIA (ICD-272.0) - on diet alone... ~  FLP 6/08 showed TChol 198, TG 47, HDL 42, LDL 147... rec> better diet, consider low dose Statin. ~  FLP 9/09 showed TChol 227, TG 94, HDL 38, LDL 181... rec> start Prav40 ($4 list)- he never tried it! ~  FLP 3/11 showed TChol 205, TG 76, HDL 46, LDL 152... he prefers diet Rx, no meds. ~  FLP 7/12 showed TChol 217, TG 63, HDL 42, LDL 172... Rec to start PRAV40 (he never did)... ~  FLP 2/14 on diet alone showed TChol 207, TG 67, HDL 47, LDL 151... Rec to START the PRAV40  HYPOTHYROIDISM (ICD-244.9) - stable on SYNTHROID157mcg/d...  ~  labs 6/08 showed TSH= 3.04 ~  labs 9/09 showed TSH= 3.16 ~  labs 3/11 showed TSH= 4.09 ~  Labs 7/12 showed TSH= 2.94 ~  Labs 2/14 => pending  INGUINAL HERNIA (ICD-550.90) - he has bilat inguinal hernias (R>L) & wears a truss... surg eval 2007 by [id number] who rec surgical repair but pt has so far declined intervention and prefers to continue wearing the truss... hernias are easily reducible... ~  3/11:  hernias w/o change- he will consider surg at [facility]. ~  6/12:  Hernias persist w/o change, I have again advised surg & gave him the name of DrHIngram at [facility]... ~  2/14:  No change and again rec to contact [facility] for repairs...  DERMATITIS, SEBORRHEIC (ICD-690.10) - he uses LOTRISONE cream Prn...   History reviewed. No pertinent past surgical history.   Outpatient Encounter Prescriptions as of 07/28/2012  Medication Sig Dispense Refill  . aspirin 81 MG tablet Take 81 mg by mouth daily.      . clotrimazole-betamethasone (LOTRISONE) cream Apply topically 2 (two) times daily.  45 g  11  . SYNTHROID 137 MCG tablet Take 1 tablet (137 mcg total) by mouth daily.  30 tablet  1  . pravastatin (PRAVACHOL) 40 MG tablet Take 1 tablet (40 mg total) by mouth every evening.  30 tablet  11   . [DISCONTINUED] nicotine (NICODERM CQ - DOSED IN MG/24 HOURS) 21 mg/24hr patch Place 1 patch onto the skin daily.         No facility-administered encounter medications on file as of 07/28/2012.    Allergies  Allergen Reactions  . Penicillins     REACTION: rash    Review of Systems    The patient denies fever, chills, sweats, anorexia, fatigue, weakness, malaise, weight loss, sleep disorder, blurring, diplopia, eye irritation, eye discharge, vision loss, eye pain, photophobia, earache, ear discharge, tinnitus, decreased hearing, nasal congestion, nosebleeds, sore throat, hoarseness, chest pain, palpitations, syncope, dyspnea on exertion, orthopnea, PND, peripheral edema, cough, dyspnea  at rest, excessive sputum, hemoptysis, wheezing, pleurisy, nausea, vomiting, diarrhea, constipation, change in bowel habits, abdominal pain, melena, hematochezia, jaundice, gas/bloating, indigestion/heartburn, dysphagia, odynophagia, dysuria, hematuria, urinary frequency, urinary hesitancy, nocturia, incontinence, back pain, joint pain, joint swelling, muscle cramps, muscle weakness, stiffness, arthritis, sciatica, restless legs, leg pain at night, leg pain with exertion, rash, itching, dryness, suspicious lesions, paralysis, paresthesias, seizures, tremors, vertigo, transient blindness, frequent falls, frequent headaches, difficulty walking, depression, anxiety, memory loss, confusion, cold intolerance, heat intolerance, polydipsia, polyphagia, polyuria, unusual weight change, abnormal bruising, bleeding, enlarged lymph nodes, urticaria, allergic rash, hay fever, and recurrent infections.     Objective:   Physical Exam      WD, WN, 62 y/o WM in NAD... GENERAL:  Alert & oriented; pleasant & cooperative... HEENT:  Fowler/AT, EOM-wnl, PERRLA, Fundi-benign, EACs-clear, TMs-wnl, NOSE-clear, THROAT-clear & wnl, TEETH-poor repair. NECK:  Supple w/ fairROM; no JVD; normal carotid impulses w/o bruits; no thyromegaly or  nodules palpated; no lymphadenopathy. CHEST:  Clear to P & A; without wheezes/ rales/ or rhonchi heard... HEART:  Regular Rhythm; without murmurs/ rubs/ or gallops detected... ABDOMEN:  Soft & nontender; normal bowel sounds; no organomegaly or masses palpated... (RECTAL:  Neg - prostate 2+ & nontender w/o nodules; stool hematest neg) EXT: without deformities or arthritic changes; +varicose veins on left leg/ +venous insuffic/ no edema. NEURO:  CN's intact; motor testing normal; sensory testing normal; gait normal & balance OK. DERM:  No lesions noted; no rash etc...  RADIOLOGY DATA:  Reviewed in the EPIC EMR & discussed w/ the patient...  LABORATORY DATA:  Reviewed in the EPIC EMR & discussed w/ the patient...   Assessment & Plan:    SMOKER/ mild chronic bronchitis/ COPD>  We discussed smoking cessation; he will try the patch again; discussed MucinexDM as needed; he refuses inhalers...  VV/ Ven Insuffic>  We discussed no salt, elevation, support hose...  CHOL>  He has refused rec for statin therapy & prev content to continue diet alone; f/u FLP w/ LDL=151 & he agrees to start the Prav40.  Hypothyroid>  He feels much better on the Levothy 138mcg/d; f/u TSH is pending...  Bilat Inguinal Hernias>  He wears a truss & is content to continue along like this; I have advised surg consult w/ DrHIngram at [facility]...  GI>  Need for screening colonoscopy; he notes his sister is working on him to get this done; we will refer to DrDBrodie..  Other medical problems as noted...   Patient's Medications  New Prescriptions        PRAVASTATIN 40mg  - one tab Qhs...  Previous Medications   ASPIRIN 81 MG TABLET    Take 81 mg by mouth daily.  Modified Medications   Modified Medication Previous Medication   CLOTRIMAZOLE-BETAMETHASONE (LOTRISONE) CREAM clotrimazole-betamethasone (LOTRISONE) cream      Apply topically 2 (two) times daily.    Apply topically 2 (two) times daily.   PRAVASTATIN (PRAVACHOL) 40  MG TABLET pravastatin (PRAVACHOL) 40 MG tablet      Take 1 tablet (40 mg total) by mouth every evening.    Take 1 tablet (40 mg total) by mouth every evening.   SYNTHROID 137 MCG TABLET SYNTHROID 137 MCG tablet      Take 1 tablet (137 mcg total) by mouth daily.    Take 1 tablet (137 mcg total) by mouth daily.  Discontinued Medications   NICOTINE (NICODERM CQ - DOSED IN MG/24 HOURS) 21 MG/24HR PATCH    Place 1 patch onto the skin daily.

## 2012-07-28 NOTE — Patient Instructions (Addendum)
Today we updated your med list in our EPIC system...    Continue your current medications the same...  Today we did your follow up CXR... Hicks, you need to quit the smoking completely!!! Please return to our lab one morning soon for your FASTING blood work...    We will contact you w/ the results when avail...  We will arrange for an appt w/ Dr. Lina Sar for you to discuss the needed screening colonoscopy...  Call for any questions.Marland KitchenMarland Kitchen

## 2012-08-05 ENCOUNTER — Telehealth: Payer: Self-pay | Admitting: Pulmonary Disease

## 2012-08-05 NOTE — Telephone Encounter (Signed)
Notes Recorded by Michele Mcalpine, MD on 07/31/2012 at 7:41 PM Please notify patient>  CXR w/ early COPD changes from smoking & some scar tissue from old infection; NAD... He must quit smoking immediately before it's too late... ---  I spoke with patient about results and he verbalized understanding and had no questions.

## 2012-08-11 ENCOUNTER — Encounter: Payer: Self-pay | Admitting: *Deleted

## 2012-08-11 ENCOUNTER — Other Ambulatory Visit (INDEPENDENT_AMBULATORY_CARE_PROVIDER_SITE_OTHER): Payer: Managed Care, Other (non HMO)

## 2012-08-11 DIAGNOSIS — Z0000 Encounter for general adult medical examination without abnormal findings: Secondary | ICD-10-CM

## 2012-08-11 DIAGNOSIS — E039 Hypothyroidism, unspecified: Secondary | ICD-10-CM

## 2012-08-11 DIAGNOSIS — E7800 Pure hypercholesterolemia, unspecified: Secondary | ICD-10-CM

## 2012-08-11 DIAGNOSIS — K4090 Unilateral inguinal hernia, without obstruction or gangrene, not specified as recurrent: Secondary | ICD-10-CM

## 2012-08-11 LAB — HEPATIC FUNCTION PANEL
AST: 18 U/L (ref 0–37)
Albumin: 4.2 g/dL (ref 3.5–5.2)
Alkaline Phosphatase: 87 U/L (ref 39–117)
Bilirubin, Direct: 0.1 mg/dL (ref 0.0–0.3)
Total Protein: 7.4 g/dL (ref 6.0–8.3)

## 2012-08-11 LAB — LIPID PANEL: Cholesterol: 207 mg/dL — ABNORMAL HIGH (ref 0–200)

## 2012-08-11 LAB — BASIC METABOLIC PANEL
BUN: 18 mg/dL (ref 6–23)
CO2: 28 mEq/L (ref 19–32)
Chloride: 100 mEq/L (ref 96–112)
Creatinine, Ser: 1.1 mg/dL (ref 0.4–1.5)
Glucose, Bld: 89 mg/dL (ref 70–99)
Potassium: 4.6 mEq/L (ref 3.5–5.1)

## 2012-08-11 LAB — CBC WITH DIFFERENTIAL/PLATELET
Basophils Absolute: 0.1 10*3/uL (ref 0.0–0.1)
Eosinophils Absolute: 0.4 10*3/uL (ref 0.0–0.7)
HCT: 46.3 % (ref 39.0–52.0)
Hemoglobin: 15.6 g/dL (ref 13.0–17.0)
Lymphs Abs: 2.5 10*3/uL (ref 0.7–4.0)
MCHC: 33.6 g/dL (ref 30.0–36.0)
Neutro Abs: 7.2 10*3/uL (ref 1.4–7.7)
Platelets: 197 10*3/uL (ref 150.0–400.0)
RDW: 13.2 % (ref 11.5–14.6)

## 2012-08-11 LAB — TSH: TSH: 5.28 u[IU]/mL (ref 0.35–5.50)

## 2012-09-21 ENCOUNTER — Encounter: Payer: Self-pay | Admitting: Internal Medicine

## 2012-09-22 ENCOUNTER — Ambulatory Visit: Payer: Managed Care, Other (non HMO) | Admitting: Internal Medicine

## 2012-11-24 ENCOUNTER — Ambulatory Visit (INDEPENDENT_AMBULATORY_CARE_PROVIDER_SITE_OTHER): Payer: Managed Care, Other (non HMO) | Admitting: Internal Medicine

## 2012-11-24 ENCOUNTER — Encounter: Payer: Self-pay | Admitting: Internal Medicine

## 2012-11-24 VITALS — BP 130/70 | HR 80 | Ht 71.5 in | Wt 180.4 lb

## 2012-11-24 DIAGNOSIS — K4090 Unilateral inguinal hernia, without obstruction or gangrene, not specified as recurrent: Secondary | ICD-10-CM

## 2012-11-24 DIAGNOSIS — Z1211 Encounter for screening for malignant neoplasm of colon: Secondary | ICD-10-CM

## 2012-11-24 MED ORDER — MOVIPREP 100 G PO SOLR: ORAL | Status: DC

## 2012-11-24 NOTE — Patient Instructions (Addendum)

## 2012-11-24 NOTE — Progress Notes (Signed)
Joseph Meyers 08-23-1950 MRN [id number]        History of Present Illness:  This is a  62 year old white male here to discuss screening colonoscopy. He has been hesitant to schedule colonoscopy for several years because of anticipated prep and liquid diet  as well as  sedation. He is asking about alternative methods for colorectal screening . He has bilateral inguinal hernias, larger on the right side and asks if it would interfere with the exam.. He has a COPD secondary to smoking and hyperlipidemia   Past Medical History  Diagnosis Date  . Unspecified hearing loss   . Tobacco use disorder   . COPD (chronic obstructive pulmonary disease)   . Varicose vein   . Pure hypercholesterolemia   . Unspecified hypothyroidism   . Inguinal hernia, without mention of obstruction or gangrene   . Seborrheic dermatitis, unspecified    Past Surgical History  Procedure Laterality Date  . External ear surgery      mastoid had nail in it, hit by nail gun  . Tonsillectomy      reports that he quit smoking yesterday. His smoking use included Cigarettes. He smoked 0.00 packs per day. He has quit using smokeless tobacco. His smokeless tobacco use included Chew. He reports that  drinks alcohol. He reports that he does not use illicit drugs. family history includes Bipolar disorder in his father; Leukemia in his mother; and Thyroid disease in his father. Allergies  Allergen Reactions  . Penicillins     REACTION: rash  . Amoxicillin Rash        Review of Systems: Denies rectal bleeding abdominal pain  The remainder of the 10 point ROS is negative except as outlined in H&P   Physical Exam: General appearance  Well developed, in no distress. Eyes- non icteric. HEENT nontraumatic, normocephalic. Mouth no lesions, tongue papillated, no cheilosis. Neck supple without adenopathy, thyroid not enlarged, no carotid bruits, no JVD. Lungs Clear to auscultation bilaterally. Cor normal S1, normal S2,  regular rhythm, no murmur,  quiet precordium. Abdomen: Soft nontender. Bilateral inguinal hernias which are easily reducible in supine  position but when standing  there is a large inguinal hernia on the right which extends into the scrotum but can be reduced with sustained pressure. It is non tender and shows no signs of torsion. Bowel sounds are normal active Rectal: Not done Extremities no pedal edema. Skin no lesions. Neurological alert and oriented x 3. Psychological normal mood and affect.  Assessment and Plan:  62 year old, white male who is  appropriate candidate for screening colonoscopy but he has been hesitant to schedule the procedure because of the anxieties with prep and the sedation. He has bilateral inguinal hernias which are easily reducible and should not pose a problem during colonoscopy. He has a mild COPD. He is a candidate for monitored sedation. He agreed to scheduled later this summer. He understands that barium enema or virtual colonoscopies are diagnostic and that he could end up with colonoscopy if polyps were.found.   11/24/2012 Lina Sar

## 2012-11-25 ENCOUNTER — Encounter: Payer: Self-pay | Admitting: Internal Medicine

## 2013-02-10 ENCOUNTER — Encounter: Payer: Managed Care, Other (non HMO) | Admitting: Internal Medicine

## 2013-08-25 ENCOUNTER — Telehealth: Payer: Self-pay | Admitting: Pulmonary Disease

## 2013-08-25 MED ORDER — SYNTHROID 137 MCG PO TABS
137.0000 ug | ORAL_TABLET | Freq: Every day | ORAL | Status: DC
Start: 2013-08-25 — End: 2013-11-23

## 2013-08-25 NOTE — Telephone Encounter (Signed)
I will forward to Leigh to advise on PCP referral for patient as well for refill as Triage is not sure what protocol SN is Leigh have for the refills. Thanks.

## 2013-08-25 NOTE — Telephone Encounter (Signed)
I called and made pt aware RX has been sent. He is going to call downstairs for appt. Nothing further needed

## 2013-08-25 NOTE — Telephone Encounter (Signed)
Per SN---   Ok to set with any of the [facility] primary care offices that are closer for the pt.  Ok to send in the refills of the medications until the pt can be seen by his new primary care.  thanks

## 2013-11-23 ENCOUNTER — Telehealth: Payer: Self-pay | Admitting: Pulmonary Disease

## 2013-11-23 MED ORDER — SYNTHROID 137 MCG PO TABS: 137.0000 ug | ORAL_TABLET | Freq: Every day | ORAL | Status: DC

## 2013-11-23 NOTE — Telephone Encounter (Signed)
Spoke with the pt  He was asking about seeing and PCP downstairs  I advised that they are no longer taking new pt's and tried giving him the number to [facility]  He states he wants SN's recs on who to see  I advised that SN does not have any provider in particular he is referring to  Pt understands this, but states that "Dr Kriste Basque knows how I am, and I want to know who he feels would be the best fit" He is aware that SN is out of the office until next wk and okay with waiting  Please advise, thanks!

## 2013-11-30 NOTE — Telephone Encounter (Signed)
Per SN---   [facility]---Dr. Ginette OttoFry New doctor is coming downstairs at the [place] office----Dr. Andree CossKollar---she will be here in September.

## 2013-11-30 NOTE — Telephone Encounter (Signed)
lmtcb

## 2013-12-01 NOTE — Telephone Encounter (Signed)
Called and spoke to pt. Informed pt of SN recs of which MD he preferred. Pt verbalized understanding and denied any further questions or concerns at this time.

## 2014-04-05 ENCOUNTER — Encounter: Payer: Self-pay | Admitting: Family

## 2014-04-05 ENCOUNTER — Ambulatory Visit (INDEPENDENT_AMBULATORY_CARE_PROVIDER_SITE_OTHER): Payer: BC Managed Care – PPO | Admitting: Family

## 2014-04-05 VITALS — BP 132/82 | HR 85 | Temp 98.2°F | Resp 18 | Ht 72.0 in | Wt 182.4 lb

## 2014-04-05 DIAGNOSIS — E039 Hypothyroidism, unspecified: Secondary | ICD-10-CM

## 2014-04-05 DIAGNOSIS — Z23 Encounter for immunization: Secondary | ICD-10-CM

## 2014-04-05 DIAGNOSIS — E7800 Pure hypercholesterolemia, unspecified: Secondary | ICD-10-CM

## 2014-04-05 MED ORDER — CLOTRIMAZOLE-BETAMETHASONE 1-0.05 % EX CREA: TOPICAL_CREAM | Freq: Two times a day (BID) | CUTANEOUS | Status: DC

## 2014-04-05 MED ORDER — SYNTHROID 137 MCG PO TABS: 137.0000 ug | ORAL_TABLET | Freq: Every day | ORAL | Status: DC

## 2014-04-05 NOTE — Assessment & Plan Note (Signed)
Currently maintained on 137 mcg of Synthroid. Last TSH from 2014, will obtain TSH to determine current status. Continue current dose of 137 mcg of Synthroid until TSH drawn - Rx refilled.

## 2014-04-05 NOTE — Progress Notes (Signed)
Pre visit review using our clinic review tool, if applicable. No additional management support is needed unless otherwise documented below in the visit note. 

## 2014-04-05 NOTE — Progress Notes (Signed)
Subjective:    Patient ID: Joseph Meyers, male    DOB: [birth year], 63 y.o.   MRN: [id number]  Chief Complaint  Patient presents with  . Establish Care    No new issues   HPI:  Joseph Meyers is a 63 y.o. male who presents today to establish care.  1) Hypothyroidism - Currently taking synthroid 137 mcg. Denies any adverse effects. Denies heat / cold intolerance or changes to hair/skin/nails. Would like refill of his Synthroid.   Lab Results  Component Value Date   TSH 5.28 08/11/2012   2) Hypercholesterolemia - Was originally prescribed pravastatin, however never had the prescription filled secondary to side effects he read about. Not currently taking any medication.   Lab Results  Component Value Date   CHOL 207* 08/11/2012   HDL 46.60 08/11/2012   LDLCALC 147* 11/28/2006   LDLDIRECT 150.7 08/11/2012   TRIG 67.0 08/11/2012   CHOLHDL 4 08/11/2012   Allergies  Allergen Reactions  . Clindamycin/Lincomycin Itching  . Penicillins     REACTION: rash  . Amoxicillin Rash   Current Outpatient Prescriptions on File Prior to Visit  Medication Sig Dispense Refill  . aspirin 81 MG tablet Take 81 mg by mouth daily.      [name] MOVIPREP 100 G SOLR Use per prep instructions  1 kit  0  . pravastatin (PRAVACHOL) 40 MG tablet Take 1 tablet (40 mg total) by mouth every evening.  30 tablet  11   No current facility-administered medications on file prior to visit.    Review of Systems    See HPI Objective:    BP 132/82  Pulse 85  Temp(Src) 98.2 F (36.8 C) (Oral)  Resp 18  Ht 6' (1.829 m)  Wt 182 lb 6.4 oz (82.736 kg)  BMI 24.73 kg/m2  SpO2 96% Nursing note and vital signs reviewed.  Physical Exam  Constitutional: He is oriented to person, place, and time. He appears well-developed and well-nourished. No distress.  Neck: Neck supple. No JVD present. No tracheal deviation present. No thyromegaly present.  Cardiovascular: Normal rate, regular rhythm and normal heart sounds.     Pulmonary/Chest: Effort normal and breath sounds normal.  Lymphadenopathy:    He has no cervical adenopathy.  Neurological: He is alert and oriented to person, place, and time.  Skin: Skin is warm and dry.  Psychiatric: He has a normal mood and affect. His behavior is normal. Judgment and thought content normal.       Assessment & Plan:

## 2014-04-05 NOTE — Assessment & Plan Note (Signed)
Last lipid panel reviewed. Last lipid panel from 2014, will obtain new lipid panel to determine current status. Continue current treatment of diet and exercise.

## 2014-04-05 NOTE — Patient Instructions (Signed)
Thank you for choosing [facility].  Summary/Instructions:   Please stop and get your blood work prior to your next appointment.  Please schedule a time for your physical at your convenience.   Your prescriptions have been sent to your pharmacy.

## 2015-04-20 ENCOUNTER — Other Ambulatory Visit (INDEPENDENT_AMBULATORY_CARE_PROVIDER_SITE_OTHER): Payer: BLUE CROSS/BLUE SHIELD

## 2015-04-20 ENCOUNTER — Telehealth: Payer: Self-pay | Admitting: Family

## 2015-04-20 ENCOUNTER — Ambulatory Visit (INDEPENDENT_AMBULATORY_CARE_PROVIDER_SITE_OTHER): Payer: BLUE CROSS/BLUE SHIELD | Admitting: Family

## 2015-04-20 ENCOUNTER — Encounter: Payer: Self-pay | Admitting: Family

## 2015-04-20 VITALS — BP 128/84 | HR 67 | Temp 97.7°F | Resp 18 | Ht 71.5 in | Wt 175.0 lb

## 2015-04-20 DIAGNOSIS — R29898 Other symptoms and signs involving the musculoskeletal system: Secondary | ICD-10-CM | POA: Diagnosis not present

## 2015-04-20 DIAGNOSIS — E039 Hypothyroidism, unspecified: Secondary | ICD-10-CM

## 2015-04-20 DIAGNOSIS — L608 Other nail disorders: Secondary | ICD-10-CM

## 2015-04-20 DIAGNOSIS — E7800 Pure hypercholesterolemia, unspecified: Secondary | ICD-10-CM | POA: Diagnosis not present

## 2015-04-20 LAB — LIPID PANEL
CHOL/HDL RATIO: 4
Cholesterol: 204 mg/dL — ABNORMAL HIGH (ref 0–200)
HDL: 52.1 mg/dL (ref >39.00)
LDL Cholesterol: 140 mg/dL — ABNORMAL HIGH (ref 0–99)
NonHDL: 151.81
Triglycerides: 60 mg/dL (ref 0.0–149.0)
VLDL: 12 mg/dL (ref 0.0–40.0)

## 2015-04-20 LAB — TSH: TSH: 6.21 u[IU]/mL — ABNORMAL HIGH (ref 0.35–4.50)

## 2015-04-20 MED ORDER — CLOTRIMAZOLE-BETAMETHASONE 1-0.05 % EX CREA: TOPICAL_CREAM | Freq: Two times a day (BID) | CUTANEOUS | Status: DC

## 2015-04-20 MED ORDER — SYNTHROID 150 MCG PO TABS: 150.0000 ug | ORAL_TABLET | Freq: Every day | ORAL | Status: DC

## 2015-04-20 MED ORDER — ROSUVASTATIN CALCIUM 10 MG PO TABS: 10.0000 mg | ORAL_TABLET | Freq: Every day | ORAL | Status: DC

## 2015-04-20 NOTE — Telephone Encounter (Signed)
Please inform patient that his TSH is elevated at 6.21 therefore I have sent in a new dose of thyroid medication to the pharmacy. Your cholesterol remains the same and therefore I would recommend starting medication as your risk for coronary artery disease is 9% in the next 10 years which is above the recommend 7.5%. Therefore I will send the generic for Crestor to his pharmacy. Please notify us if he develops muscle pain. Follow up in 6 weeks for TSH check.

## 2015-04-20 NOTE — Assessment & Plan Note (Signed)
Change in mental appearance of undetermined origin, although appears potential fungal infection. Refer to dermatology for further assessment and management. Trial over-the-counter nail fungus Lacher as needed. Follow-up if symptoms worsen or fail to improve prior to dermatology appointment.

## 2015-04-20 NOTE — Patient Instructions (Addendum)
Thank you for choosing [facility].  Summary/Instructions:  Please stop by the lab on the basement level of the building for your blood work. Your results will be released to MyChart (or called to you) after review, usually within 72 hours after test completion. If any changes need to be made, you will be notified at that same time.  If your symptoms worsen or fail to improve, please contact our office for further instruction, or in case of emergency go directly to the emergency room at the closest medical facility.   Follow up with dermatology for your nails.
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
## 2015-04-20 NOTE — Progress Notes (Signed)
Pre visit review using our clinic review tool, if applicable. No additional management support is needed unless otherwise documented below in the visit note. 

## 2015-04-20 NOTE — Assessment & Plan Note (Signed)
Previously recommended to start on pravastatin secondary to increased risk for coronary artery disease within the next 7-1/2 years. Patient has been noncompliant and has yet to start this regimen. Obtain lipid profile to check current cholesterol status. Follow-up pending blood work results.

## 2015-04-20 NOTE — Assessment & Plan Note (Signed)
Currently maintained on Synthroid. Takes medications prescribed and denies adverse side effects. Obtain TSH to determine current status. Follow-up pending TSH results.

## 2015-04-21 ENCOUNTER — Other Ambulatory Visit: Payer: Self-pay | Admitting: Family

## 2015-04-21 MED ORDER — CLOTRIMAZOLE-BETAMETHASONE 1-0.05 % EX CREA: TOPICAL_CREAM | Freq: Two times a day (BID) | CUTANEOUS | Status: DC

## 2015-04-21 NOTE — Telephone Encounter (Signed)
Pt aware of results 

## 2015-05-15 ENCOUNTER — Ambulatory Visit (INDEPENDENT_AMBULATORY_CARE_PROVIDER_SITE_OTHER): Payer: BLUE CROSS/BLUE SHIELD | Admitting: Family

## 2015-05-15 ENCOUNTER — Encounter: Payer: Self-pay | Admitting: Family

## 2015-05-15 VITALS — BP 124/84 | HR 86 | Temp 97.7°F | Resp 18 | Ht 71.5 in | Wt 176.0 lb

## 2015-05-15 DIAGNOSIS — S39012D Strain of muscle, fascia and tendon of lower back, subsequent encounter: Secondary | ICD-10-CM

## 2015-05-15 DIAGNOSIS — M542 Cervicalgia: Secondary | ICD-10-CM | POA: Diagnosis not present

## 2015-05-15 DIAGNOSIS — S39012A Strain of muscle, fascia and tendon of lower back, initial encounter: Secondary | ICD-10-CM | POA: Insufficient documentation

## 2015-05-15 NOTE — Progress Notes (Signed)
Pre visit review using our clinic review tool, if applicable. No additional management support is needed unless otherwise documented below in the visit note. 
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
## 2015-05-15 NOTE — Assessment & Plan Note (Signed)
Neck pain related to possible strain or even facet syndrome. Continue heat and OTC medications as needed for symptom relief. Start home exercise therapy. Follow up if symptoms worsen or fails to improve.

## 2015-05-15 NOTE — Patient Instructions (Signed)
Thank you for choosing [facility].  Summary/Instructions:  Your prescription(s) have been submitted to your pharmacy or been printed and provided for you. Please take as directed and contact our office if you believe you are having problem(s) with the medication(s) or have any questions.  If your symptoms worsen or fail to improve, please contact our office for further instruction, or in case of emergency go directly to the emergency room at the closest medical facility.    Cervical Strain and Sprain With Rehab Cervical strain and sprain are injuries that commonly occur with "whiplash" injuries. Whiplash occurs when the neck is forcefully whipped backward or forward, such as during a motor vehicle accident or during contact sports. The muscles, ligaments, tendons, discs, and nerves of the neck are susceptible to injury when this occurs. RISK FACTORS Risk of having a whiplash injury increases if:  Osteoarthritis of the spine.  Situations that make head or neck accidents or trauma more likely.  High-risk sports (football, rugby, wrestling, hockey, auto racing, gymnastics, diving, contact karate, or boxing).  Poor strength and flexibility of the neck.  Previous neck injury.  Poor tackling technique.  Improperly fitted or padded equipment. SYMPTOMS   Pain or stiffness in the front or back of neck or both.  Symptoms may present immediately or up to 24 hours after injury.  Dizziness, headache, nausea, and vomiting.  Muscle spasm with soreness and stiffness in the neck.  Tenderness and swelling at the injury site. PREVENTION  Learn and use proper technique (avoid tackling with the head, spearing, and head-butting; use proper falling techniques to avoid landing on the head).  Warm up and stretch properly before activity.  Maintain physical fitness:  Strength, flexibility, and endurance.  Cardiovascular fitness.  Wear properly fitted and padded protective equipment,  such as padded soft collars, for participation in contact sports. PROGNOSIS  Recovery from cervical strain and sprain injuries is dependent on the extent of the injury. These injuries are usually curable in 1 week to 3 months with appropriate treatment.  RELATED COMPLICATIONS   Temporary numbness and weakness may occur if the nerve roots are damaged, and this may persist until the nerve has completely healed.  Chronic pain due to frequent recurrence of symptoms.  Prolonged healing, especially if activity is resumed too soon (before complete recovery). TREATMENT  Treatment initially involves the use of ice and medication to help reduce pain and inflammation. It is also important to perform strengthening and stretching exercises and modify activities that worsen symptoms so the injury does not get worse. These exercises may be performed at home or with a therapist. For patients who experience severe symptoms, a soft, padded collar may be recommended to be worn around the neck.  Improving your posture may help reduce symptoms. Posture improvement includes pulling your chin and abdomen in while sitting or standing. If you are sitting, sit in a firm chair with your buttocks against the back of the chair. While sleeping, try replacing your pillow with a small towel rolled to 2 inches in diameter, or use a cervical pillow or soft cervical collar. Poor sleeping positions delay healing.  For patients with nerve root damage, which causes numbness or weakness, the use of a cervical traction apparatus may be recommended. Surgery is rarely necessary for these injuries. However, cervical strain and sprains that are present at birth (congenital) may require surgery. MEDICATION   If pain medication is necessary, nonsteroidal anti-inflammatory medications, such as aspirin and ibuprofen, or other minor pain relievers,  such as acetaminophen, are often recommended.  Do not take pain medication for 7 days before  surgery.  Prescription pain relievers may be given if deemed necessary by your caregiver. Use only as directed and only as much as you need. HEAT AND COLD:   Cold treatment (icing) relieves pain and reduces inflammation. Cold treatment should be applied for 10 to 15 minutes every 2 to 3 hours for inflammation and pain and immediately after any activity that aggravates your symptoms. Use ice packs or an ice massage.  Heat treatment may be used prior to performing the stretching and strengthening activities prescribed by your caregiver, physical therapist, or athletic trainer. Use a heat pack or a warm soak. SEEK MEDICAL CARE IF:   Symptoms get worse or do not improve in 2 weeks despite treatment.  New, unexplained symptoms develop (drugs used in treatment may produce side effects). EXERCISES RANGE OF MOTION (ROM) AND STRETCHING EXERCISES - Cervical Strain and Sprain These exercises may help you when beginning to rehabilitate your injury. In order to successfully resolve your symptoms, you must improve your posture. These exercises are designed to help reduce the forward-head and rounded-shoulder posture which contributes to this condition. Your symptoms may resolve with or without further involvement from your physician, physical therapist or athletic trainer. While completing these exercises, remember:   Restoring tissue flexibility helps normal motion to return to the joints. This allows healthier, less painful movement and activity.  An effective stretch should be held for at least 20 seconds, although you may need to begin with shorter hold times for comfort.  A stretch should never be painful. You should only feel a gentle lengthening or release in the stretched tissue. STRETCH- Axial Extensors  Lie on your back on the floor. You may bend your knees for comfort. Place a rolled-up hand towel or dish towel, about 2 inches in diameter, under the part of your head that makes contact with the  floor.  Gently tuck your chin, as if trying to make a "double chin," until you feel a gentle stretch at the base of your head.  Hold __________ seconds. Repeat __________ times. Complete this exercise __________ times per day.  STRETCH - Axial Extension   Stand or sit on a firm surface. Assume a good posture: chest up, shoulders drawn back, abdominal muscles slightly tense, knees unlocked (if standing) and feet hip width apart.  Slowly retract your chin so your head slides back and your chin slightly lowers. Continue to look straight ahead.  You should feel a gentle stretch in the back of your head. Be certain not to feel an aggressive stretch since this can cause headaches later.  Hold for __________ seconds. Repeat __________ times. Complete this exercise __________ times per day. STRETCH - Cervical Side Bend   Stand or sit on a firm surface. Assume a good posture: chest up, shoulders drawn back, abdominal muscles slightly tense, knees unlocked (if standing) and feet hip width apart.  Without letting your nose or shoulders move, slowly tip your right / left ear to your shoulder until your feel a gentle stretch in the muscles on the opposite side of your neck.  Hold __________ seconds. Repeat __________ times. Complete this exercise __________ times per day. STRETCH - Cervical Rotators   Stand or sit on a firm surface. Assume a good posture: chest up, shoulders drawn back, abdominal muscles slightly tense, knees unlocked (if standing) and feet hip width apart.  Keeping your eyes level with the ground,  slowly turn your head until you feel a gentle stretch along the back and opposite side of your neck.  Hold __________ seconds. Repeat __________ times. Complete this exercise __________ times per day. RANGE OF MOTION - Neck Circles   Stand or sit on a firm surface. Assume a good posture: chest up, shoulders drawn back, abdominal muscles slightly tense, knees unlocked (if standing) and  feet hip width apart.  Gently roll your head down and around from the back of one shoulder to the back of the other. The motion should never be forced or painful.  Repeat the motion 10-20 times, or until you feel the neck muscles relax and loosen. Repeat __________ times. Complete the exercise __________ times per day. STRENGTHENING EXERCISES - Cervical Strain and Sprain These exercises may help you when beginning to rehabilitate your injury. They may resolve your symptoms with or without further involvement from your physician, physical therapist, or athletic trainer. While completing these exercises, remember:   Muscles can gain both the endurance and the strength needed for everyday activities through controlled exercises.  Complete these exercises as instructed by your physician, physical therapist, or athletic trainer. Progress the resistance and repetitions only as guided.  You may experience muscle soreness or fatigue, but the pain or discomfort you are trying to eliminate should never worsen during these exercises. If this pain does worsen, stop and make certain you are following the directions exactly. If the pain is still present after adjustments, discontinue the exercise until you can discuss the trouble with your clinician. STRENGTH - Cervical Flexors, Isometric  Face a wall, standing about 6 inches away. Place a small pillow, a ball about 6-8 inches in diameter, or a folded towel between your forehead and the wall.  Slightly tuck your chin and gently push your forehead into the soft object. Push only with mild to moderate intensity, building up tension gradually. Keep your jaw and forehead relaxed.  Hold 10 to 20 seconds. Keep your breathing relaxed.  Release the tension slowly. Relax your neck muscles completely before you start the next repetition. Repeat __________ times. Complete this exercise __________ times per day. STRENGTH- Cervical Lateral Flexors, Isometric   Stand  about 6 inches away from a wall. Place a small pillow, a ball about 6-8 inches in diameter, or a folded towel between the side of your head and the wall.  Slightly tuck your chin and gently tilt your head into the soft object. Push only with mild to moderate intensity, building up tension gradually. Keep your jaw and forehead relaxed.  Hold 10 to 20 seconds. Keep your breathing relaxed.  Release the tension slowly. Relax your neck muscles completely before you start the next repetition. Repeat __________ times. Complete this exercise __________ times per day. STRENGTH - Cervical Extensors, Isometric   Stand about 6 inches away from a wall. Place a small pillow, a ball about 6-8 inches in diameter, or a folded towel between the back of your head and the wall.  Slightly tuck your chin and gently tilt your head back into the soft object. Push only with mild to moderate intensity, building up tension gradually. Keep your jaw and forehead relaxed.  Hold 10 to 20 seconds. Keep your breathing relaxed.  Release the tension slowly. Relax your neck muscles completely before you start the next repetition. Repeat __________ times. Complete this exercise __________ times per day. POSTURE AND BODY MECHANICS CONSIDERATIONS - Cervical Strain and Sprain Keeping correct posture when sitting, standing or completing your  activities will reduce the stress put on different body tissues, allowing injured tissues a chance to heal and limiting painful experiences. The following are general guidelines for improved posture. Your physician or physical therapist will provide you with any instructions specific to your needs. While reading these guidelines, remember:  The exercises prescribed by your provider will help you have the flexibility and strength to maintain correct postures.  The correct posture provides the optimal environment for your joints to work. All of your joints have less wear and tear when properly  supported by a spine with good posture. This means you will experience a healthier, less painful body.  Correct posture must be practiced with all of your activities, especially prolonged sitting and standing. Correct posture is as important when doing repetitive low-stress activities (typing) as it is when doing a single heavy-load activity (lifting). PROLONGED STANDING WHILE SLIGHTLY LEANING FORWARD When completing a task that requires you to lean forward while standing in one place for a long time, place either foot up on a stationary 2- to 4-inch high object to help maintain the best posture. When both feet are on the ground, the low back tends to lose its slight inward curve. If this curve flattens (or becomes too large), then the back and your other joints will experience too much stress, fatigue more quickly, and can cause pain.  RESTING POSITIONS Consider which positions are most painful for you when choosing a resting position. If you have pain with flexion-based activities (sitting, bending, stooping, squatting), choose a position that allows you to rest in a less flexed posture. You would want to avoid curling into a fetal position on your side. If your pain worsens with extension-based activities (prolonged standing, working overhead), avoid resting in an extended position such as sleeping on your stomach. Most people will find more comfort when they rest with their spine in a more neutral position, neither too rounded nor too arched. Lying on a non-sagging bed on your side with a pillow between your knees, or on your back with a pillow under your knees will often provide some relief. Keep in mind, being in any one position for a prolonged period of time, no matter how correct your posture, can still lead to stiffness. WALKING Walk with an upright posture. Your ears, shoulders, and hips should all line up. OFFICE WORK When working at a desk, create an environment that supports good, upright  posture. Without extra support, muscles fatigue and lead to excessive strain on joints and other tissues. CHAIR:  A chair should be able to slide under your desk when your back makes contact with the back of the chair. This allows you to work closely.  The chair's height should allow your eyes to be level with the upper part of your monitor and your hands to be slightly lower than your elbows.  Body position:  Your feet should make contact with the floor. If this is not possible, use a foot rest.  Keep your ears over your shoulders. This will reduce stress on your neck and low back.   This information is not intended to replace advice given to you by your health care provider. Make sure you discuss any questions you have with your health care provider.   Document Released: 06/03/2005 Document Revised: 06/24/2014 Document Reviewed: 09/15/2008 [employer] Interactive Patient Education 2016 [employer]  Low Back Sprain With Rehab A sprain is an injury in which a ligament is torn. The ligaments of the lower back  are vulnerable to sprains. However, they are strong and require great force to be injured. These ligaments are important for stabilizing the spinal column. Sprains are classified into three categories. Grade 1 sprains cause pain, but the tendon is not lengthened. Grade 2 sprains include a lengthened ligament, due to the ligament being stretched or partially ruptured. With grade 2 sprains there is still function, although the function may be decreased. Grade 3 sprains involve a complete tear of the tendon or muscle, and function is usually impaired. SYMPTOMS   Severe pain in the lower back.  Sometimes, a feeling of a "pop," "snap," or tear, at the time of injury.  Tenderness and sometimes swelling at the injury site.  Uncommonly, bruising (contusion) within 48 hours of injury.  Muscle spasms in the back. CAUSES  Low back sprains occur when a force is placed on the ligaments that is  greater than they can handle. Common causes of injury include:  Performing a stressful act while off-balance.  Repetitive stressful activities that involve movement of the lower back.  Direct hit (trauma) to the lower back. RISK INCREASES WITH:  Contact sports (football, wrestling).  Collisions (major skiing accidents).  Sports that require throwing or lifting (baseball, weightlifting).  Sports involving twisting of the spine (gymnastics, diving, tennis, golf).  Poor strength and flexibility.  Inadequate protection.  Previous back injury or surgery (especially fusion). PREVENTION  Wear properly fitted and padded protective equipment.  Warm up and stretch properly before activity.  Allow for adequate recovery between workouts.  Maintain physical fitness:  Strength, flexibility, and endurance.  Cardiovascular fitness.  Maintain a healthy body weight. PROGNOSIS  If treated properly, low back sprains usually heal with non-surgical treatment. The length of time for healing depends on the severity of the injury.  RELATED COMPLICATIONS   Recurring symptoms, resulting in a chronic problem.  Chronic inflammation and pain in the low back.  Delayed healing or resolution of symptoms, especially if activity is resumed too soon.  Prolonged impairment.  Unstable or arthritic joints of the low back. TREATMENT  Treatment first involves the use of ice and medicine, to reduce pain and inflammation. The use of strengthening and stretching exercises may help reduce pain with activity. These exercises may be performed at home or with a therapist. Severe injuries may require referral to a therapist for further evaluation and treatment, such as ultrasound. Your caregiver may advise that you wear a back brace or corset, to help reduce pain and discomfort. Often, prolonged bed rest results in greater harm then benefit. Corticosteroid injections may be recommended. However, these should be  reserved for the most serious cases. It is important to avoid using your back when lifting objects. At night, sleep on your back on a firm mattress, with a pillow placed under your knees. If non-surgical treatment is unsuccessful, surgery may be needed.  MEDICATION   If pain medicine is needed, nonsteroidal anti-inflammatory medicines (aspirin and ibuprofen), or other minor pain relievers (acetaminophen), are often advised.  Do not take pain medicine for 7 days before surgery.  Prescription pain relievers may be given, if your caregiver thinks they are needed. Use only as directed and only as much as you need.  Ointments applied to the skin may be helpful.  Corticosteroid injections may be given by your caregiver. These injections should be reserved for the most serious cases, because they may only be given a certain number of times. HEAT AND COLD  Cold treatment (icing) should be applied for  10 to 15 minutes every 2 to 3 hours for inflammation and pain, and immediately after activity that aggravates your symptoms. Use ice packs or an ice massage.  Heat treatment may be used before performing stretching and strengthening activities prescribed by your caregiver, physical therapist, or athletic trainer. Use a heat pack or a warm water soak. SEEK MEDICAL CARE IF:   Symptoms get worse or do not improve in 2 to 4 weeks, despite treatment.  You develop numbness or weakness in either leg.  You lose bowel or bladder function.  Any of the following occur after surgery: fever, increased pain, swelling, redness, drainage of fluids, or bleeding in the affected area.  New, unexplained symptoms develop. (Drugs used in treatment may produce side effects.) EXERCISES  RANGE OF MOTION (ROM) AND STRETCHING EXERCISES - Low Back Sprain Most people with lower back pain will find that their symptoms get worse with excessive bending forward (flexion) or arching at the lower back (extension). The exercises that  will help resolve your symptoms will focus on the opposite motion.  Your physician, physical therapist or athletic trainer will help you determine which exercises will be most helpful to resolve your lower back pain. Do not complete any exercises without first consulting with your caregiver. Discontinue any exercises which make your symptoms worse, until you speak to your caregiver. If you have pain, numbness or tingling which travels down into your buttocks, leg or foot, the goal of the therapy is for these symptoms to move closer to your back and eventually resolve. Sometimes, these leg symptoms will get better, but your lower back pain may worsen. This is often an indication of progress in your rehabilitation. Be very alert to any changes in your symptoms and the activities in which you participated in the 24 hours prior to the change. Sharing this information with your caregiver will allow him or her to most efficiently treat your condition. These exercises may help you when beginning to rehabilitate your injury. Your symptoms may resolve with or without further involvement from your physician, physical therapist or athletic trainer. While completing these exercises, remember:   Restoring tissue flexibility helps normal motion to return to the joints. This allows healthier, less painful movement and activity.  An effective stretch should be held for at least 30 seconds.  A stretch should never be painful. You should only feel a gentle lengthening or release in the stretched tissue. FLEXION RANGE OF MOTION AND STRETCHING EXERCISES: STRETCH - Flexion, Single Knee to Chest   Lie on a firm bed or floor with both legs extended in front of you.  Keeping one leg in contact with the floor, bring your opposite knee to your chest. Hold your leg in place by either grabbing behind your thigh or at your knee.  Pull until you feel a gentle stretch in your low back. Hold __________ seconds.  Slowly release  your grasp and repeat the exercise with the opposite side. Repeat __________ times. Complete this exercise __________ times per day.  STRETCH - Flexion, Double Knee to Chest  Lie on a firm bed or floor with both legs extended in front of you.  Keeping one leg in contact with the floor, bring your opposite knee to your chest.  Tense your stomach muscles to support your back and then lift your other knee to your chest. Hold your legs in place by either grabbing behind your thighs or at your knees.  Pull both knees toward your chest until you feel  a gentle stretch in your low back. Hold __________ seconds.  Tense your stomach muscles and slowly return one leg at a time to the floor. Repeat __________ times. Complete this exercise __________ times per day.  STRETCH - Low Trunk Rotation  Lie on a firm bed or floor. Keeping your legs in front of you, bend your knees so they are both pointed toward the ceiling and your feet are flat on the floor.  Extend your arms out to the side. This will stabilize your upper body by keeping your shoulders in contact with the floor.  Gently and slowly drop both knees together to one side until you feel a gentle stretch in your low back. Hold for __________ seconds.  Tense your stomach muscles to support your lower back as you bring your knees back to the starting position. Repeat the exercise to the other side. Repeat __________ times. Complete this exercise __________ times per day  EXTENSION RANGE OF MOTION AND FLEXIBILITY EXERCISES: STRETCH - Extension, Prone on Elbows   Lie on your stomach on the floor, a bed will be too soft. Place your palms about shoulder width apart and at the height of your head.  Place your elbows under your shoulders. If this is too painful, stack pillows under your chest.  Allow your body to relax so that your hips drop lower and make contact more completely with the floor.  Hold this position for __________ seconds.  Slowly  return to lying flat on the floor. Repeat __________ times. Complete this exercise __________ times per day.  RANGE OF MOTION - Extension, Prone Press Ups  Lie on your stomach on the floor, a bed will be too soft. Place your palms about shoulder width apart and at the height of your head.  Keeping your back as relaxed as possible, slowly straighten your elbows while keeping your hips on the floor. You may adjust the placement of your hands to maximize your comfort. As you gain motion, your hands will come more underneath your shoulders.  Hold this position __________ seconds.  Slowly return to lying flat on the floor. Repeat __________ times. Complete this exercise __________ times per day.  RANGE OF MOTION- Quadruped, Neutral Spine   Assume a hands and knees position on a firm surface. Keep your hands under your shoulders and your knees under your hips. You may place padding under your knees for comfort.  Drop your head and point your tailbone toward the ground below you. This will round out your lower back like an angry cat. Hold this position for __________ seconds.  Slowly lift your head and release your tail bone so that your back sags into a large arch, like an old horse.  Hold this position for __________ seconds.  Repeat this until you feel limber in your low back.  Now, find your "sweet spot." This will be the most comfortable position somewhere between the two previous positions. This is your neutral spine. Once you have found this position, tense your stomach muscles to support your low back.  Hold this position for __________ seconds. Repeat __________ times. Complete this exercise __________ times per day.  STRENGTHENING EXERCISES - Low Back Sprain These exercises may help you when beginning to rehabilitate your injury. These exercises should be done near your "sweet spot." This is the neutral, low-back arch, somewhere between fully rounded and fully arched, that is your  least painful position. When performed in this safe range of motion, these exercises can be used  for people who have either a flexion or extension based injury. These exercises may resolve your symptoms with or without further involvement from your physician, physical therapist or athletic trainer. While completing these exercises, remember:   Muscles can gain both the endurance and the strength needed for everyday activities through controlled exercises.  Complete these exercises as instructed by your physician, physical therapist or athletic trainer. Increase the resistance and repetitions only as guided.  You may experience muscle soreness or fatigue, but the pain or discomfort you are trying to eliminate should never worsen during these exercises. If this pain does worsen, stop and make certain you are following the directions exactly. If the pain is still present after adjustments, discontinue the exercise until you can discuss the trouble with your caregiver. STRENGTHENING - Deep Abdominals, Pelvic Tilt   Lie on a firm bed or floor. Keeping your legs in front of you, bend your knees so they are both pointed toward the ceiling and your feet are flat on the floor.  Tense your lower abdominal muscles to press your low back into the floor. This motion will rotate your pelvis so that your tail bone is scooping upwards rather than pointing at your feet or into the floor. With a gentle tension and even breathing, hold this position for __________ seconds. Repeat __________ times. Complete this exercise __________ times per day.  STRENGTHENING - Abdominals, Crunches   Lie on a firm bed or floor. Keeping your legs in front of you, bend your knees so they are both pointed toward the ceiling and your feet are flat on the floor. Cross your arms over your chest.  Slightly tip your chin down without bending your neck.  Tense your abdominals and slowly lift your trunk high enough to just clear your  shoulder blades. Lifting higher can put excessive stress on the lower back and does not further strengthen your abdominal muscles.  Control your return to the starting position. Repeat __________ times. Complete this exercise __________ times per day.  STRENGTHENING - Quadruped, Opposite UE/LE Lift   Assume a hands and knees position on a firm surface. Keep your hands under your shoulders and your knees under your hips. You may place padding under your knees for comfort.  Find your neutral spine and gently tense your abdominal muscles so that you can maintain this position. Your shoulders and hips should form a rectangle that is parallel with the floor and is not twisted.  Keeping your trunk steady, lift your right hand no higher than your shoulder and then your left leg no higher than your hip. Make sure you are not holding your breath. Hold this position for __________ seconds.  Continuing to keep your abdominal muscles tense and your back steady, slowly return to your starting position. Repeat with the opposite arm and leg. Repeat __________ times. Complete this exercise __________ times per day.  STRENGTHENING - Abdominals and Quadriceps, Straight Leg Raise   Lie on a firm bed or floor with both legs extended in front of you.  Keeping one leg in contact with the floor, bend the other knee so that your foot can rest flat on the floor.  Find your neutral spine, and tense your abdominal muscles to maintain your spinal position throughout the exercise.  Slowly lift your straight leg off the floor about 6 inches for a count of 15, making sure to not hold your breath.  Still keeping your neutral spine, slowly lower your leg all the way to  the floor. Repeat this exercise with each leg __________ times. Complete this exercise __________ times per day. POSTURE AND BODY MECHANICS CONSIDERATIONS - Low Back Sprain Keeping correct posture when sitting, standing or completing your activities will  reduce the stress put on different body tissues, allowing injured tissues a chance to heal and limiting painful experiences. The following are general guidelines for improved posture. Your physician or physical therapist will provide you with any instructions specific to your needs. While reading these guidelines, remember:  The exercises prescribed by your provider will help you have the flexibility and strength to maintain correct postures.  The correct posture provides the best environment for your joints to work. All of your joints have less wear and tear when properly supported by a spine with good posture. This means you will experience a healthier, less painful body.  Correct posture must be practiced with all of your activities, especially prolonged sitting and standing. Correct posture is as important when doing repetitive low-stress activities (typing) as it is when doing a single heavy-load activity (lifting). RESTING POSITIONS Consider which positions are most painful for you when choosing a resting position. If you have pain with flexion-based activities (sitting, bending, stooping, squatting), choose a position that allows you to rest in a less flexed posture. You would want to avoid curling into a fetal position on your side. If your pain worsens with extension-based activities (prolonged standing, working overhead), avoid resting in an extended position such as sleeping on your stomach. Most people will find more comfort when they rest with their spine in a more neutral position, neither too rounded nor too arched. Lying on a non-sagging bed on your side with a pillow between your knees, or on your back with a pillow under your knees will often provide some relief. Keep in mind, being in any one position for a prolonged period of time, no matter how correct your posture, can still lead to stiffness. PROPER SITTING POSTURE In order to minimize stress and discomfort on your spine, you must  sit with correct posture. Sitting with good posture should be effortless for a healthy body. Returning to good posture is a gradual process. Many people can work toward this most comfortably by using various supports until they have the flexibility and strength to maintain this posture on their own. When sitting with proper posture, your ears will fall over your shoulders and your shoulders will fall over your hips. You should use the back of the chair to support your upper back. Your lower back will be in a neutral position, just slightly arched. You may place a small pillow or folded towel at the base of your lower back for  support.  When working at a desk, create an environment that supports good, upright posture. Without extra support, muscles tire, which leads to excessive strain on joints and other tissues. Keep these recommendations in mind: CHAIR:  A chair should be able to slide under your desk when your back makes contact with the back of the chair. This allows you to work closely.  The chair's height should allow your eyes to be level with the upper part of your monitor and your hands to be slightly lower than your elbows. BODY POSITION  Your feet should make contact with the floor. If this is not possible, use a foot rest.  Keep your ears over your shoulders. This will reduce stress on your neck and low back. INCORRECT SITTING POSTURES  If you are feeling tired  and unable to assume a healthy sitting posture, do not slouch or slump. This puts excessive strain on your back tissues, causing more damage and pain. Healthier options include:  Using more support, like a lumbar pillow.  Switching tasks to something that requires you to be upright or walking.  Talking a brief walk.  Lying down to rest in a neutral-spine position. PROLONGED STANDING WHILE SLIGHTLY LEANING FORWARD  When completing a task that requires you to lean forward while standing in one place for a long time, place  either foot up on a stationary 2-4 inch high object to help maintain the best posture. When both feet are on the ground, the lower back tends to lose its slight inward curve. If this curve flattens (or becomes too large), then the back and your other joints will experience too much stress, tire more quickly, and can cause pain. CORRECT STANDING POSTURES Proper standing posture should be assumed with all daily activities, even if they only take a few moments, like when brushing your teeth. As in sitting, your ears should fall over your shoulders and your shoulders should fall over your hips. You should keep a slight tension in your abdominal muscles to brace your spine. Your tailbone should point down to the ground, not behind your body, resulting in an over-extended swayback posture.  INCORRECT STANDING POSTURES  Common incorrect standing postures include a forward head, locked knees and/or an excessive swayback. WALKING Walk with an upright posture. Your ears, shoulders and hips should all line-up. PROLONGED ACTIVITY IN A FLEXED POSITION When completing a task that requires you to bend forward at your waist or lean over a low surface, try to find a way to stabilize 3 out of 4 of your limbs. You can place a hand or elbow on your thigh or rest a knee on the surface you are reaching across. This will provide you more stability, so that your muscles do not tire as quickly. By keeping your knees relaxed, or slightly bent, you will also reduce stress across your lower back. CORRECT LIFTING TECHNIQUES DO :  Assume a wide stance. This will provide you more stability and the opportunity to get as close as possible to the object which you are lifting.  Tense your abdominals to brace your spine. Bend at the knees and hips. Keeping your back locked in a neutral-spine position, lift using your leg muscles. Lift with your legs, keeping your back straight.  Test the weight of unknown objects before attempting to  lift them.  Try to keep your elbows locked down at your sides in order get the best strength from your shoulders when carrying an object.  Always ask for help when lifting heavy or awkward objects. INCORRECT LIFTING TECHNIQUES DO NOT:   Lock your knees when lifting, even if it is a small object.  Bend and twist. Pivot at your feet or move your feet when needing to change directions.  Assume that you can safely pick up even a paperclip without proper posture.   This information is not intended to replace advice given to you by your health care provider. Make sure you discuss any questions you have with your health care provider.   Document Released: 06/03/2005 Document Revised: 06/24/2014 Document Reviewed: 09/15/2008 Elsevier Interactive Patient Education [employer]

## 2015-05-31 ENCOUNTER — Telehealth: Payer: Self-pay

## 2015-05-31 NOTE — Telephone Encounter (Signed)
Left Voice Mail for pt to call back.   RE: Flu Vaccine for 2016  

## 2015-06-24 ENCOUNTER — Other Ambulatory Visit: Payer: Self-pay | Admitting: Family

## 2015-07-10 ENCOUNTER — Ambulatory Visit (INDEPENDENT_AMBULATORY_CARE_PROVIDER_SITE_OTHER)
Admission: RE | Admit: 2015-07-10 | Discharge: 2015-07-10 | Disposition: A | Discharge status: Home / Self Care | Payer: BLUE CROSS/BLUE SHIELD | Source: Ambulatory Visit | Attending: Internal Medicine | Admitting: Internal Medicine

## 2015-07-10 ENCOUNTER — Encounter: Payer: Self-pay | Admitting: Internal Medicine

## 2015-07-10 ENCOUNTER — Ambulatory Visit (INDEPENDENT_AMBULATORY_CARE_PROVIDER_SITE_OTHER): Payer: BLUE CROSS/BLUE SHIELD | Admitting: Internal Medicine

## 2015-07-10 VITALS — BP 140/80 | HR 72 | Ht 72.0 in | Wt 177.0 lb

## 2015-07-10 DIAGNOSIS — F17210 Nicotine dependence, cigarettes, uncomplicated: Secondary | ICD-10-CM | POA: Diagnosis not present

## 2015-07-10 DIAGNOSIS — R05 Cough: Secondary | ICD-10-CM

## 2015-07-10 DIAGNOSIS — F17200 Nicotine dependence, unspecified, uncomplicated: Secondary | ICD-10-CM

## 2015-07-10 DIAGNOSIS — R059 Cough, unspecified: Secondary | ICD-10-CM

## 2015-07-10 MED ORDER — AZITHROMYCIN 250 MG PO TABS: ORAL_TABLET | ORAL | Status: DC

## 2015-07-10 NOTE — Progress Notes (Signed)
   Subjective:    Patient ID: Joseph Meyers, male    DOB: [birth year],    MRN: [id number]  HPI  38 yowm cigar smoker self referred for cough to pulmonary clinic 07/10/2015    07/10/2015 1st [facility] office visit/ Shila Kruczek   Chief Complaint  Patient presents with  . Pulmonary Consult    Former patient of Dr Kriste Basque. He c/o cough x 5 days- prod with clear sputum.   onset of a "cold" was 07/02/15 then 07/06/15 p neck massage coughed up yellow phlegm but turned clear over next several days and persistent cough mostly daytime/ not disturbing sleep and Not limited by breathing from desired activities  Or needing saba/ better with rob dm   No obvious day to day or daytime variability or assoc  cp or chest tightness, subjective wheeze or overt sinus or hb symptoms. No unusual exp hx or h/o childhood pna/ asthma or knowledge of premature birth.  Sleeping ok without nocturnal  or early am exacerbation  of respiratory  c/o's or need for noct saba. Also denies any obvious fluctuation of symptoms with weather or environmental changes or other aggravating or alleviating factors except as outlined above   Current Medications, Allergies, Complete Past Medical History, Past Surgical History, Family History, and Social History were reviewed in [employer] record.        Review of Systems  Constitutional: Negative for fever, chills, activity change, appetite change and unexpected weight change.  HENT: Positive for congestion. Negative for dental problem, postnasal drip, rhinorrhea, sneezing, sore throat, trouble swallowing and voice change.   Eyes: Negative for visual disturbance.  Respiratory: Positive for cough. Negative for choking and shortness of breath.   Cardiovascular: Negative for chest pain and leg swelling.  Gastrointestinal: Negative for nausea, vomiting and abdominal pain.  Genitourinary: Negative for difficulty urinating.  Musculoskeletal: Negative for arthralgias.    Skin: Negative for rash.  Psychiatric/Behavioral: Negative for behavioral problems and confusion.       Objective:   Physical Exam  amb wm nad   Wt Readings from Last 3 Encounters:  07/10/15 177 lb (80.287 kg)  05/15/15 176 lb (79.833 kg)  04/20/15 175 lb (79.379 kg)    Vital signs reviewed   HEENT: nl dentition, turbinates, and oropharynx. Nl external ear canals without cough reflex   NECK :  without JVD/Nodes/TM/ nl carotid upstrokes bilaterally   LUNGS: no acc muscle use,  Nl contour chest which is clear to A and P bilaterally without cough on insp or exp maneuvers   CV:  RRR  no s3 or murmur or increase in P2, no edema   ABD:  soft and nontender with nl inspiratory excursion in the supine position. No bruits or organomegaly, bowel sounds nl  MS:  Nl gait/ ext warm without deformities, calf tenderness, cyanosis or clubbing No obvious joint restrictions   SKIN: warm and dry without lesions    NEURO:  alert, approp, nl sensorium with  no motor deficits      CXR PA and Lateral:   07/10/2015 :    I personally reviewed images and agree with radiology impression as follows:    1. Bronchitic changes. 2. No focal acute pulmonary abnormality. 3. Left pleural thickening or pleural effusion. My impression:  Very minimal blunting of L cp angle/ not specific and no dedicated f/u needed       Assessment & Plan:

## 2015-07-10 NOTE — Assessment & Plan Note (Addendum)
>   3 m discussion  Advised on complete smoking in abstinence while acutely ill and longterm commitment to quit as well

## 2015-07-10 NOTE — Assessment & Plan Note (Signed)
Reviewed guidelines for the treatment of uncomplicated uri's (which is what this is until/unless proven otherwise and under what circumstances he may need to see a specialist in the future but that we don't do better with the common cold than his primary care provider  I did offer a zpak if the discolored sputum recurs and otherwise just treat the symptoms with mucinex / advil cold and sinus and f/u prn

## 2015-07-10 NOTE — Patient Instructions (Addendum)
For cough  > mucinex dm up to 1200 mg every 12 hours as needed  For nasal congestion/ sore throat / sinus Headache > advil cold and sinus  IF mucus gets nasty again > rec zpak   Please remember to go to the x-ray department downstairs for your tests - we will call you with the results when they are available.

## 2015-07-11 ENCOUNTER — Encounter: Payer: Self-pay | Admitting: Internal Medicine

## 2015-07-11 NOTE — Progress Notes (Signed)
Quick Note:  LMTCB ______ 

## 2015-07-11 NOTE — Progress Notes (Signed)
Quick Note:  Spoke with pt and notified of results per Dr. Wert. Pt verbalized understanding and denied any questions.  ______ 

## 2015-07-28 ENCOUNTER — Other Ambulatory Visit: Payer: Self-pay | Admitting: Family

## 2015-07-28 NOTE — Telephone Encounter (Signed)
Patient called to follow up on this request. States that he did take his last pill and he does need an answer on the labs. Advised that it is still outstanding and that we would reach out to him

## 2015-07-28 NOTE — Telephone Encounter (Signed)
Pt has also called regarding this medication. He took his last pill today. He was hoping another months worth until he can come in for his lab work. He was wondering if he needed to fast before he came in for his labs, but I do not see any lab orders. Can you please give him a call.

## 2015-08-01 ENCOUNTER — Other Ambulatory Visit: Payer: Self-pay

## 2015-08-01 MED ORDER — SYNTHROID 150 MCG PO TABS: ORAL_TABLET | ORAL | Status: DC

## 2015-08-21 ENCOUNTER — Telehealth: Payer: Self-pay | Admitting: Family

## 2015-08-21 NOTE — Telephone Encounter (Signed)
Patient states that Joseph Meyers was to put orders into the system for him to have his thyroid checked and some other labs.  He is wanting to come in Thursday morning.  He is requesting labs to be entered.  Please follow up in regards.

## 2015-08-22 NOTE — Telephone Encounter (Signed)
Review chart. Labs are ready for pt. Can you call and let him know pls?

## 2015-08-22 NOTE — Telephone Encounter (Signed)
Notified patient.

## 2015-08-24 ENCOUNTER — Telehealth: Payer: Self-pay | Admitting: Family

## 2015-08-24 ENCOUNTER — Telehealth: Payer: Self-pay | Admitting: Emergency Medicine

## 2015-08-24 ENCOUNTER — Other Ambulatory Visit (INDEPENDENT_AMBULATORY_CARE_PROVIDER_SITE_OTHER): Payer: BLUE CROSS/BLUE SHIELD

## 2015-08-24 DIAGNOSIS — E039 Hypothyroidism, unspecified: Secondary | ICD-10-CM

## 2015-08-24 DIAGNOSIS — E786 Lipoprotein deficiency: Secondary | ICD-10-CM | POA: Diagnosis not present

## 2015-08-24 LAB — BASIC METABOLIC PANEL
BUN: 15 mg/dL (ref 6–23)
CALCIUM: 9.8 mg/dL (ref 8.4–10.5)
CO2: 29 meq/L (ref 19–32)
CREATININE: 1.03 mg/dL (ref 0.40–1.50)
Chloride: 103 mEq/L (ref 96–112)
GFR: 77.5 mL/min (ref >60.00)
Glucose, Bld: 94 mg/dL (ref 70–99)
Potassium: 5.3 mEq/L — ABNORMAL HIGH (ref 3.5–5.1)
SODIUM: 139 meq/L (ref 135–145)

## 2015-08-24 LAB — HEPATIC FUNCTION PANEL
ALK PHOS: 105 U/L (ref 39–117)
ALT: 13 U/L (ref 0–53)
AST: 14 U/L (ref 0–37)
Albumin: 4.6 g/dL (ref 3.5–5.2)
BILIRUBIN DIRECT: 0.1 mg/dL (ref 0.0–0.3)
TOTAL PROTEIN: 8 g/dL (ref 6.0–8.3)
Total Bilirubin: 0.5 mg/dL (ref 0.2–1.2)

## 2015-08-24 LAB — LIPID PANEL
CHOLESTEROL: 206 mg/dL — AB (ref 0–200)
HDL: 51.6 mg/dL (ref >39.00)
LDL CALC: 144 mg/dL — AB (ref 0–99)
NonHDL: 154.3
TRIGLYCERIDES: 50 mg/dL (ref 0.0–149.0)
Total CHOL/HDL Ratio: 4
VLDL: 10 mg/dL (ref 0.0–40.0)

## 2015-08-24 LAB — CBC
HEMATOCRIT: 50.2 % (ref 39.0–52.0)
HEMOGLOBIN: 17.1 g/dL — AB (ref 13.0–17.0)
MCHC: 34 g/dL (ref 30.0–36.0)
MCV: 89.1 fl (ref 78.0–100.0)
Platelets: 208 10*3/uL (ref 150.0–400.0)
RBC: 5.63 Mil/uL (ref 4.22–5.81)
RDW: 14.2 % (ref 11.5–15.5)
WBC: 7.9 10*3/uL (ref 4.0–10.5)

## 2015-08-24 LAB — TSH: TSH: 3.86 u[IU]/mL (ref 0.35–4.50)

## 2015-08-24 MED ORDER — ROSUVASTATIN CALCIUM 20 MG PO TABS: 20.0000 mg | ORAL_TABLET | Freq: Every day | ORAL | Status: DC

## 2015-08-24 NOTE — Telephone Encounter (Signed)
Please inform patient that his thyroid function is within the normal ranges and therefore no changes are needed to his current medication level. If he is in need of additional Synthroid, we can send in a prescription for the current dosage. His cholesterol shows that his total cholesterol is 206 with a goal less than 200 and his LDL is 144 with a goal of less than 100. Therefore I have sent in a new prescription for his Crestor increasing it to 20 mg daily. Please have him follow up for an office visit in 3 months.

## 2015-08-24 NOTE — Telephone Encounter (Signed)
Lab called to inform that labs were not put in correctly. Labs have been re-entered.

## 2015-08-25 MED ORDER — SYNTHROID 150 MCG PO TABS
150.0000 ug | ORAL_TABLET | Freq: Every day | ORAL | Status: DC
Start: 2015-08-25 — End: 2016-03-07

## 2015-08-25 MED ORDER — ROSUVASTATIN CALCIUM 10 MG PO TABS: 10.0000 mg | ORAL_TABLET | Freq: Every day | ORAL | Status: DC

## 2015-08-25 NOTE — Telephone Encounter (Signed)
Pt informed of results. Pt has not taken the 10 mg crestor, pt would like to start the 10 mg. Okay to cancel the 20 mg?

## 2015-08-25 NOTE — Telephone Encounter (Signed)
Ok to start at 10 mg. Please have him follow up in 3 months.

## 2015-08-25 NOTE — Telephone Encounter (Signed)
Pt informed of dose change.   Pharmacy informed.

## 2015-09-28 DIAGNOSIS — S139XXA Sprain of joints and ligaments of unspecified parts of neck, initial encounter: Secondary | ICD-10-CM | POA: Diagnosis not present

## 2015-09-28 DIAGNOSIS — M542 Cervicalgia: Secondary | ICD-10-CM | POA: Diagnosis not present

## 2016-03-07 ENCOUNTER — Encounter: Payer: Self-pay | Admitting: Family

## 2016-03-07 ENCOUNTER — Ambulatory Visit (INDEPENDENT_AMBULATORY_CARE_PROVIDER_SITE_OTHER): Payer: BLUE CROSS/BLUE SHIELD | Admitting: Family

## 2016-03-07 VITALS — BP 141/82 | HR 77 | Temp 98.2°F | Resp 16 | Ht 72.0 in | Wt 172.0 lb

## 2016-03-07 DIAGNOSIS — E039 Hypothyroidism, unspecified: Secondary | ICD-10-CM | POA: Diagnosis not present

## 2016-03-07 DIAGNOSIS — R21 Rash and other nonspecific skin eruption: Secondary | ICD-10-CM | POA: Diagnosis not present

## 2016-03-07 DIAGNOSIS — E7800 Pure hypercholesterolemia, unspecified: Secondary | ICD-10-CM | POA: Diagnosis not present

## 2016-03-07 MED ORDER — ROSUVASTATIN CALCIUM 10 MG PO TABS: 10.0000 mg | ORAL_TABLET | Freq: Every day | ORAL | 0 refills | Status: DC

## 2016-03-07 MED ORDER — CLOTRIMAZOLE-BETAMETHASONE 1-0.05 % EX CREA: TOPICAL_CREAM | Freq: Two times a day (BID) | CUTANEOUS | 1 refills | Status: DC

## 2016-03-07 MED ORDER — ITRACONAZOLE 200 MG PO TABS: 200.0000 mg | ORAL_TABLET | Freq: Two times a day (BID) | ORAL | 0 refills | Status: DC

## 2016-03-07 MED ORDER — SYNTHROID 150 MCG PO TABS: 150.0000 ug | ORAL_TABLET | Freq: Every day | ORAL | 0 refills | Status: DC

## 2016-03-07 NOTE — Assessment & Plan Note (Signed)
Sporadic itchy rash consistent with possible tinea and refractory to Lotrisone. Start itraconazole. If symptoms do not improve recommend follow-up with dermatology.

## 2016-03-07 NOTE — Progress Notes (Signed)
Subjective:    Patient ID: Corrie Mckusick, male    DOB: 1951-01-20, 65 y.o.   MRN: [id number]  Chief Complaint  Patient presents with  . Follow-up    refill of synthroid     HPI:  RAMESES OU is a 65 y.o. male who  has a past medical history of COPD (chronic obstructive pulmonary disease) ([place]); Inguinal hernia, without mention of obstruction or gangrene; Pure hypercholesterolemia; Seborrheic dermatitis, unspecified; Tobacco use disorder; Unspecified hearing loss; Unspecified hypothyroidism; and Varicose vein. and presents today for a follow up office visit.   1.) Hypothyroidism -  Currently maintained on Synthroid. Reports taking the medication as prescribed and denies adverse side effects. No changes to skin/hair/nails, fatigue or temperature intolerance.   Lab Results  Component Value Date   TSH 3.86 08/24/2015    2.) Hyperlipidemia - Currently prescribed rosuvastatin. Reports that he has not yet started to take the medication. Indicates that he is lacking the motivation to take it on a regular basis.   Lab Results  Component Value Date   CHOL 206 (H) 08/24/2015   HDL 51.60 08/24/2015   LDLCALC 144 (H) 08/24/2015   LDLDIRECT 150.7 08/11/2012   TRIG 50.0 08/24/2015   CHOLHDL 4 08/24/2015   3.) Rash - Associated symptom of a rash located on his back on the right buttocks have been going on for several months and been refractory to the modifying factor of lotrisone cream. Described as itchy on occasion with no burning. Not sure it has spread since initially noting it. Does have a dermatologist who he has not seen recently.    Allergies  Allergen Reactions  . Clindamycin/Lincomycin Itching  . Penicillins     REACTION: rash  . Amoxicillin Rash      Outpatient Medications Prior to Visit  Medication Sig Dispense Refill  . aspirin 81 MG tablet Take 81 mg by mouth daily.    . clotrimazole-betamethasone (LOTRISONE) cream Apply topically 2 (two) times daily. 45 g 6  .  SYNTHROID 150 MCG tablet Take 1 tablet (150 mcg total) by mouth daily before breakfast. 90 tablet 1  . azithromycin (ZITHROMAX) 250 MG tablet Take 2 on day one then 1 daily x 4 days 6 tablet 0  . rosuvastatin (CRESTOR) 10 MG tablet Take 1 tablet (10 mg total) by mouth daily. (Patient not taking: Reported on 03/07/2016) 90 tablet 0   No facility-administered medications prior to visit.       Past Surgical History:  Procedure Laterality Date  . EXTERNAL EAR SURGERY     mastoid had nail in it, hit by nail gun  . TONSILLECTOMY        Past Medical History:  Diagnosis Date  . COPD (chronic obstructive pulmonary disease) ([place])   . Inguinal hernia, without mention of obstruction or gangrene   . Pure hypercholesterolemia   . Seborrheic dermatitis, unspecified   . Tobacco use disorder   . Unspecified hearing loss   . Unspecified hypothyroidism   . Varicose vein     Review of Systems  Constitutional: Negative for chills, fatigue and fever.  Eyes:       Negative for changes in vision  Respiratory: Negative for cough, chest tightness and wheezing.   Cardiovascular: Negative for chest pain, palpitations and leg swelling.  Endocrine: Negative for cold intolerance and heat intolerance.  Skin: Positive for rash.  Neurological: Negative for dizziness, weakness and light-headedness.      Objective:    BP (!) 141/82 (  BP Location: Left Arm, Patient Position: Sitting, Cuff Size: Normal)   Pulse 77   Temp 98.2 F (36.8 C) (Oral)   Resp 16   Ht 6' (1.829 m)   Wt 172 lb (78 kg)   SpO2 95%   BMI 23.33 kg/m  Nursing note and vital signs reviewed.  Physical Exam  Constitutional: He is oriented to person, place, and time. He appears well-developed and well-nourished. No distress.  Neck: Neck supple. No thyromegaly present.  Cardiovascular: Normal rate, regular rhythm, normal heart sounds and intact distal pulses.   Pulmonary/Chest: Effort normal and breath sounds normal.  Neurological:  He is alert and oriented to person, place, and time.  Skin: Skin is warm and dry. Rash (Sporadic well defined lesions that are blanacheable and appear to have darker borders. ) noted.  Psychiatric: He has a normal mood and affect. His behavior is normal. Judgment and thought content normal.       Assessment & Plan:   Problem List Items Addressed This Visit      Endocrine   Hypothyroidism - Primary    Thyroid appears stable with current dosage of levothyroxine with no adverse side effects or symptoms of hypothyroidism. No thyromegaly noted upon exam. Obtain TSH. Continue current dosages of Synthroid pending TSH results.      Relevant Medications   SYNTHROID 150 MCG tablet   Other Relevant Orders   TSH     Musculoskeletal and Integument   Rash and nonspecific skin eruption    Sporadic itchy rash consistent with possible tinea and refractory to Lotrisone. Start itraconazole. If symptoms do not improve recommend follow-up with dermatology.        Other   HYPERCHOLESTEROLEMIA    Hyperlipidemia with noncompliance with medication regimen. Emphasize importance of taking medication as prescribed to reduce risk for cardiovascular disease in the future. Obtain lipid profile and complete metabolic profile for therapeutic drug monitoring.. Recommended continuing current dosage of simvastatin pending lipid profile results.      Relevant Medications   rosuvastatin (CRESTOR) 10 MG tablet   Other Relevant Orders   Comp Met (CMET)   Lipid Profile    Other Visit Diagnoses   None.      I have discontinued Mr. Majano's azithromycin. I am also having him start on Itraconazole. Additionally, I am having him maintain his aspirin, rosuvastatin, SYNTHROID, and clotrimazole-betamethasone.   Meds ordered this encounter  Medications  . rosuvastatin (CRESTOR) 10 MG tablet    Sig: Take 1 tablet (10 mg total) by mouth daily.    Dispense:  30 tablet    Refill:  0    Order Specific Question:    Supervising Provider    Answer:   Pricilla Holm A [[id number]]  . SYNTHROID 150 MCG tablet    Sig: Take 1 tablet (150 mcg total) by mouth daily before breakfast.    Dispense:  30 tablet    Refill:  0    Order Specific Question:   Supervising Provider    Answer:   Pricilla Holm A [[id number]]  . Itraconazole 200 MG TABS    Sig: Take 200 mg by mouth 2 (two) times daily.    Dispense:  14 tablet    Refill:  0    Order Specific Question:   Supervising Provider    Answer:   Pricilla Holm A [[id number]]  . clotrimazole-betamethasone (LOTRISONE) cream    Sig: Apply topically 2 (two) times daily.    Dispense:  45 g    Refill:  1    Order Specific Question:   Supervising Provider    Answer:   Hoyt Koch [[id number]]     Follow-up: Return in about 2 months (around 05/07/2016), or if symptoms worsen or fail to improve.  Mauricio Po, FNP

## 2016-03-07 NOTE — Patient Instructions (Addendum)
Thank you for choosing [facility].  SUMMARY AND INSTRUCTIONS:  Medication:  Please take your medications as prescrbed.   Your prescription(s) have been submitted to your pharmacy or been printed and provided for you. Please take as directed and contact our office if you believe you are having problem(s) with the medication(s) or have any questions.  Labs:  Please stop by the lab on the lower level of the building for your blood work. Your results will be released to MyChart (or called to you) after review, usually within 72 hours after test completion. If any changes need to be made, you will be notified at that same time.  1.) The lab is open from 7:30am to 5:30 pm Monday-Friday 2.) No appointment is necessary 3.) Fasting (if needed) is 6-8 hours after food and drink; black coffee and water are okay   Follow up:  If your symptoms worsen or fail to improve, please contact our office for further instruction, or in case of emergency go directly to the emergency room at the closest medical facility.

## 2016-03-07 NOTE — Assessment & Plan Note (Addendum)
Hyperlipidemia with noncompliance with medication regimen. Emphasize importance of taking medication as prescribed to reduce risk for cardiovascular disease in the future. Obtain lipid profile and complete metabolic profile for therapeutic drug monitoring.. Recommended continuing current dosage of simvastatin pending lipid profile results.

## 2016-03-07 NOTE — Assessment & Plan Note (Signed)
Thyroid appears stable with current dosage of levothyroxine with no adverse side effects or symptoms of hypothyroidism. No thyromegaly noted upon exam. Obtain TSH. Continue current dosages of Synthroid pending TSH results.

## 2016-03-28 DIAGNOSIS — L821 Other seborrheic keratosis: Secondary | ICD-10-CM | POA: Diagnosis not present

## 2016-03-28 DIAGNOSIS — L218 Other seborrheic dermatitis: Secondary | ICD-10-CM | POA: Diagnosis not present

## 2016-03-28 DIAGNOSIS — D225 Melanocytic nevi of trunk: Secondary | ICD-10-CM | POA: Diagnosis not present

## 2016-03-28 DIAGNOSIS — L720 Epidermal cyst: Secondary | ICD-10-CM | POA: Diagnosis not present

## 2016-04-06 ENCOUNTER — Other Ambulatory Visit: Payer: Self-pay | Admitting: Family

## 2016-04-06 DIAGNOSIS — E7800 Pure hypercholesterolemia, unspecified: Secondary | ICD-10-CM

## 2016-04-06 DIAGNOSIS — E039 Hypothyroidism, unspecified: Secondary | ICD-10-CM

## 2016-05-10 ENCOUNTER — Other Ambulatory Visit: Payer: Self-pay | Admitting: Family

## 2016-05-10 DIAGNOSIS — E039 Hypothyroidism, unspecified: Secondary | ICD-10-CM

## 2016-05-13 ENCOUNTER — Other Ambulatory Visit (INDEPENDENT_AMBULATORY_CARE_PROVIDER_SITE_OTHER): Payer: BLUE CROSS/BLUE SHIELD

## 2016-05-13 ENCOUNTER — Other Ambulatory Visit: Payer: Self-pay | Admitting: Family

## 2016-05-13 DIAGNOSIS — E7800 Pure hypercholesterolemia, unspecified: Secondary | ICD-10-CM | POA: Diagnosis not present

## 2016-05-13 DIAGNOSIS — E039 Hypothyroidism, unspecified: Secondary | ICD-10-CM

## 2016-05-13 LAB — LIPID PANEL
CHOL/HDL RATIO: 4
Cholesterol: 210 mg/dL — ABNORMAL HIGH (ref 0–200)
HDL: 47 mg/dL (ref >39.00)
LDL CALC: 142 mg/dL — AB (ref 0–99)
NONHDL: 163.19
Triglycerides: 104 mg/dL (ref 0.0–149.0)
VLDL: 20.8 mg/dL (ref 0.0–40.0)

## 2016-05-13 LAB — TSH: TSH: 2.32 u[IU]/mL (ref 0.35–4.50)

## 2016-05-13 LAB — COMPREHENSIVE METABOLIC PANEL
ALT: 11 U/L (ref 0–53)
AST: 14 U/L (ref 0–37)
Albumin: 4.5 g/dL (ref 3.5–5.2)
Alkaline Phosphatase: 98 U/L (ref 39–117)
BILIRUBIN TOTAL: 0.5 mg/dL (ref 0.2–1.2)
BUN: 14 mg/dL (ref 6–23)
CHLORIDE: 103 meq/L (ref 96–112)
CO2: 30 meq/L (ref 19–32)
CREATININE: 0.94 mg/dL (ref 0.40–1.50)
Calcium: 9.6 mg/dL (ref 8.4–10.5)
GFR: 85.92 mL/min (ref >60.00)
GLUCOSE: 85 mg/dL (ref 70–99)
Potassium: 4.9 mEq/L (ref 3.5–5.1)
SODIUM: 139 meq/L (ref 135–145)
Total Protein: 7.6 g/dL (ref 6.0–8.3)

## 2016-05-13 MED ORDER — ROSUVASTATIN CALCIUM 20 MG PO TABS: 20.0000 mg | ORAL_TABLET | Freq: Every day | ORAL | 0 refills | Status: DC

## 2016-05-15 ENCOUNTER — Other Ambulatory Visit: Payer: Self-pay

## 2016-05-15 DIAGNOSIS — E7800 Pure hypercholesterolemia, unspecified: Secondary | ICD-10-CM

## 2016-05-15 DIAGNOSIS — E039 Hypothyroidism, unspecified: Secondary | ICD-10-CM

## 2016-05-15 MED ORDER — ROSUVASTATIN CALCIUM 10 MG PO TABS: 10.0000 mg | ORAL_TABLET | Freq: Every day | ORAL | 1 refills | Status: DC

## 2016-05-15 MED ORDER — ROSUVASTATIN CALCIUM 10 MG PO TABS: 10.0000 mg | ORAL_TABLET | Freq: Every day | ORAL | 0 refills | Status: DC

## 2016-05-15 MED ORDER — SYNTHROID 150 MCG PO TABS: 150.0000 ug | ORAL_TABLET | Freq: Every day | ORAL | 3 refills | Status: DC

## 2017-06-26 ENCOUNTER — Telehealth: Payer: Self-pay | Admitting: Family

## 2017-06-26 NOTE — Telephone Encounter (Signed)
Joseph Meyers is not longer at this office. The patient is transferring [facility], This information will need to go to them. Thank you.

## 2017-06-26 NOTE — Telephone Encounter (Addendum)
Copied from CRM [id number]. Topic: Quick Communication - See Telephone Encounter >> Jun 26, 2017  4:52 PM Trula SladeWalter, Linda F wrote: CRM for notification. See Telephone encounter for:  06/26/17. Patient would like a refill on his SYNTHROID 150 MCG tablet  Medication, but he wants the provider to check Generic so he can get it cheaper. He said he has enough medication for this weekend but then he's out.

## 2017-06-30 ENCOUNTER — Other Ambulatory Visit: Payer: Self-pay | Admitting: Family Medicine

## 2017-06-30 DIAGNOSIS — E039 Hypothyroidism, unspecified: Secondary | ICD-10-CM

## 2017-06-30 NOTE — Telephone Encounter (Signed)
Copied from CRM [phone]#[id number]. Topic: Quick Communication - Rx Refill/Question >> Jun 30, 2017  3:46 PM Everardo PacificMoton, Einer Meals, [place] wrote: Medication: Synthroid 150 MCG   Has the patient contacted their pharmacy? No.  Patient called because he needs a refill on his Synthroid medication but he is a former patient of [facility] that's why he didn't call the pharmacy because he didn't know who they would send is request to. Patient does have a new patient appointment on 07-03-2017 @2 :45 with Dr.McGowen.Patient stated that he would be ok with the generic brand as well. Stated that he has taken his last pill today   Preferred Pharmacy: [facility] [place] [address] [phone]  Agent: Please be advised that RX refills may take up to 3 business days. We ask that you follow-up with your pharmacy.

## 2017-06-30 NOTE — Telephone Encounter (Signed)
Former pt of [facility] Cal[facility],NP requesting refill of Synthroid. Pt has appt scheduled with Dr. Milinda CaveMcGowen on 07/03/17. Pt states he took his last pill today.

## 2017-07-01 MED ORDER — SYNTHROID 150 MCG PO TABS: 150.0000 ug | ORAL_TABLET | Freq: Every day | ORAL | 0 refills | Status: DC

## 2017-07-01 NOTE — Telephone Encounter (Signed)
Please advise. Thanks.  

## 2017-07-03 ENCOUNTER — Encounter: Payer: Self-pay | Admitting: Family Medicine

## 2017-07-03 ENCOUNTER — Ambulatory Visit: Payer: BLUE CROSS/BLUE SHIELD | Admitting: Family Medicine

## 2017-07-03 VITALS — BP 137/78 | HR 69 | Temp 98.2°F | Resp 16 | Ht 72.0 in | Wt 174.0 lb

## 2017-07-03 DIAGNOSIS — Z125 Encounter for screening for malignant neoplasm of prostate: Secondary | ICD-10-CM | POA: Diagnosis not present

## 2017-07-03 DIAGNOSIS — I83812 Varicose veins of left lower extremities with pain: Secondary | ICD-10-CM

## 2017-07-03 DIAGNOSIS — Z23 Encounter for immunization: Secondary | ICD-10-CM

## 2017-07-03 DIAGNOSIS — E039 Hypothyroidism, unspecified: Secondary | ICD-10-CM

## 2017-07-03 DIAGNOSIS — E7800 Pure hypercholesterolemia, unspecified: Secondary | ICD-10-CM

## 2017-07-03 MED ORDER — LEVOTHYROXINE SODIUM 150 MCG PO TABS: 150.0000 ug | ORAL_TABLET | Freq: Every day | ORAL | 0 refills | Status: DC

## 2017-07-03 NOTE — Progress Notes (Signed)
Office Note 07/03/2017  CC:  Chief Complaint  Patient presents with  . Establish Care  . Follow-up    RCI    HPI:  Joseph Meyers is a 67 y.o. White male who is a new pt to me today, transferring from [facility]. He was last seen at that office 03/07/16.  Has not seen another PCP or gotten any labs in the interim. Old records in EPIC/[facility] EMR were reviewed prior to or during today's visit.  No acute complaints, except for need for synthroid RF.  Takes 150 mcg daily, asks to be switched from brand name to generic.   Past Medical History:  Diagnosis Date  . Colon cancer screening    Pt has never had colonoscopy due to fear of prep; 2014 consult with Dr. Remigio EisenmengerBrodie--she recommended he get colonoscopy and he agreed but never scheduled it.  [name]. COPD (chronic obstructive pulmonary disease) ([facility])   . Inguinal hernia, without mention of obstruction or gangrene   . Pure hypercholesterolemia    he has been hesitant to start statin even though he has purchased it from pharmacy  . Seborrheic dermatitis, unspecified   . Tobacco use disorder   . Unspecified hearing loss   . Unspecified hypothyroidism   . Varicose veins of leg with pain, left    intermittently painful, wears compression hose.  The varicosities stemmed from vein injury near his knee during a rafting trip in the [place] past    Past Surgical History:  Procedure Laterality Date  . EXTERNAL EAR SURGERY     mastoid had nail in it, hit by nail gun  . TONSILLECTOMY      Family History  Problem Relation Age of Onset  . Thyroid disease Father   . Bipolar disorder Father   . Leukemia Mother        CLL    Social History   Socioeconomic History  . Marital status: Married    Spouse name: karon  . Number of children: 2  . Years of education: Not on file  . Highest education level: Not on file  Social Needs  . Financial resource strain: Not on file  . Food insecurity - worry: Not on file  .  Food insecurity - inability: Not on file  . Transportation needs - medical: Not on file  . Transportation needs - non-medical: Not on file  Occupational History  . Occupation: Engineer, manufacturing [employer]    Comment: parts delivery  Tobacco Use  . Smoking status: Current Every Day Smoker    Types: Cigarettes, Cigars    Last attempt to quit: 11/23/2012    Years since quitting: 4.6  . Smokeless tobacco: Former NeurosurgeonUser    Types: Chew  . Tobacco comment: 1 ppd  Substance and Sexual Activity  . Alcohol use: No    Frequency: Never  . Drug use: No  . Sexual activity: Not on file  Other Topics Concern  . Not on file  Social History Narrative   Married, 1 daughter and two sons.   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   Tob: cigars as of 2019.     Cigarettes: 50 pack-yr hx, quit 2013.   Alcohol: none    Outpatient Encounter Medications as of 07/03/2017  Medication Sig  . clotrimazole-betamethasone (LOTRISONE) cream Apply topically 2 (two) times daily.  [name]. desonide (DESOWEN) 0.05 % lotion Apply 1 application topically daily as needed.  . [DISCONTINUED] SYNTHROID 150 MCG tablet Take 1  tablet (150 mcg total) by mouth daily before breakfast.  . levothyroxine (SYNTHROID, LEVOTHROID) 150 MCG tablet Take 1 tablet (150 mcg total) by mouth daily.  . rosuvastatin (CRESTOR) 10 MG tablet Take 1 tablet (10 mg total) by mouth daily. (Patient not taking: Reported on 07/03/2017)  . [DISCONTINUED] aspirin 81 MG tablet Take 81 mg by mouth daily.  . [DISCONTINUED] Itraconazole 200 MG TABS Take 200 mg by mouth 2 (two) times daily. (Patient not taking: Reported on 07/03/2017)   No facility-administered encounter medications on file as of 07/03/2017.     Allergies  Allergen Reactions  . Clindamycin/Lincomycin Itching  . Penicillins     REACTION: rash  . Amoxicillin Rash    ROS Review of Systems  Constitutional: Negative for fatigue and fever.  HENT: Negative for congestion and sore throat.    Eyes: Negative for visual disturbance.  Respiratory: Negative for cough.   Cardiovascular: Negative for chest pain.  Gastrointestinal: Negative for abdominal pain and nausea.  Genitourinary: Negative for dysuria.  Musculoskeletal: Negative for back pain and joint swelling.  Skin: Negative for rash.  Neurological: Negative for weakness and headaches.  Hematological: Negative for adenopathy.    PE; Blood pressure 137/78, pulse 69, temperature 98.2 F (36.8 C), temperature source Oral, resp. rate 16, height 6' (1.829 m), weight 174 lb (78.9 kg), SpO2 96 %. Body mass index is 23.6 kg/m.  Gen: Alert, well appearing.  Patient is oriented to person, place, time, and situation. AFFECT: pleasant, lucid thought and speech. CV: RRR, no m/r/g.   LUNGS: CTA bilat, nonlabored resps, good aeration in all lung fields. ABD: soft, NT/ND EXT: no clubbing, cyanosis, or edema.  He has scattered non-inflamed varicose veins all over his left lower leg.   None on right leg.  Pertinent labs:  Lab Results  Component Value Date   TSH 2.32 05/13/2016   Lab Results  Component Value Date   WBC 7.9 08/24/2015   HGB 17.1 (H) 08/24/2015   HCT 50.2 08/24/2015   MCV 89.1 08/24/2015   PLT 208.0 08/24/2015   Lab Results  Component Value Date   CREATININE 0.94 05/13/2016   BUN 14 05/13/2016   NA 139 05/13/2016   K 4.9 05/13/2016   CL 103 05/13/2016   CO2 30 05/13/2016   Lab Results  Component Value Date   ALT 11 05/13/2016   AST 14 05/13/2016   ALKPHOS 98 05/13/2016   BILITOT 0.5 05/13/2016   Lab Results  Component Value Date   CHOL 210 (H) 05/13/2016   Lab Results  Component Value Date   HDL 47.00 05/13/2016   Lab Results  Component Value Date   LDLCALC 142 (H) 05/13/2016   Lab Results  Component Value Date   TRIG 104.0 05/13/2016   Lab Results  Component Value Date   CHOLHDL 4 05/13/2016   Lab Results  Component Value Date   PSA 0.92 08/11/2012   PSA 0.98 01/01/2011   PSA  0.92 09/12/2009   ASSESSMENT AND PLAN:   New/transfer pt:    1) Hypothyroidism: needs TSH monitoring. He preferred to get this done when he gets the remainder of his fasting labs ASAP. I rx'd a 30 day supply of generic levothyroxine , 1 qd, no RF. He is to return in 4 wks for 30 min visit that will essentially be his CPE. Flu vaccine given today.  An After Visit Summary was printed and given to the patient.  Return in about 4 weeks (around 07/31/2017) for annual  CPE (fasting not necessary): also needs lab appt for fasting labs at his earliest convenience.  Signed:  Santiago Bumpers, MD           07/03/2017

## 2017-07-10 ENCOUNTER — Other Ambulatory Visit (INDEPENDENT_AMBULATORY_CARE_PROVIDER_SITE_OTHER): Payer: BLUE CROSS/BLUE SHIELD

## 2017-07-10 ENCOUNTER — Other Ambulatory Visit: Payer: Self-pay | Admitting: Family Medicine

## 2017-07-10 ENCOUNTER — Other Ambulatory Visit: Payer: BLUE CROSS/BLUE SHIELD

## 2017-07-10 DIAGNOSIS — E7800 Pure hypercholesterolemia, unspecified: Secondary | ICD-10-CM

## 2017-07-10 DIAGNOSIS — E039 Hypothyroidism, unspecified: Secondary | ICD-10-CM | POA: Diagnosis not present

## 2017-07-10 DIAGNOSIS — Z125 Encounter for screening for malignant neoplasm of prostate: Secondary | ICD-10-CM | POA: Diagnosis not present

## 2017-07-10 DIAGNOSIS — I83812 Varicose veins of left lower extremities with pain: Secondary | ICD-10-CM

## 2017-07-10 LAB — CBC WITH DIFFERENTIAL/PLATELET
BASOS PCT: 0.9 % (ref 0.0–3.0)
Basophils Absolute: 0.1 10*3/uL (ref 0.0–0.1)
EOS ABS: 0.3 10*3/uL (ref 0.0–0.7)
Eosinophils Relative: 3.5 % (ref 0.0–5.0)
HCT: 46 % (ref 39.0–52.0)
Hemoglobin: 15.5 g/dL (ref 13.0–17.0)
Lymphocytes Relative: 30.7 % (ref 12.0–46.0)
Lymphs Abs: 2.4 10*3/uL (ref 0.7–4.0)
MCHC: 33.8 g/dL (ref 30.0–36.0)
MCV: 91.3 fl (ref 78.0–100.0)
MONO ABS: 0.7 10*3/uL (ref 0.1–1.0)
Monocytes Relative: 8.3 % (ref 3.0–12.0)
NEUTROS ABS: 4.5 10*3/uL (ref 1.4–7.7)
Neutrophils Relative %: 56.6 % (ref 43.0–77.0)
PLATELETS: 186 10*3/uL (ref 150.0–400.0)
RBC: 5.04 Mil/uL (ref 4.22–5.81)
RDW: 13.8 % (ref 11.5–15.5)
WBC: 7.9 10*3/uL (ref 4.0–10.5)

## 2017-07-10 LAB — COMPREHENSIVE METABOLIC PANEL
ALT: 9 U/L (ref 0–53)
AST: 12 U/L (ref 0–37)
Albumin: 4.2 g/dL (ref 3.5–5.2)
Alkaline Phosphatase: 93 U/L (ref 39–117)
BUN: 16 mg/dL (ref 6–23)
CALCIUM: 9 mg/dL (ref 8.4–10.5)
CHLORIDE: 102 meq/L (ref 96–112)
CO2: 30 meq/L (ref 19–32)
Creatinine, Ser: 0.91 mg/dL (ref 0.40–1.50)
GFR: 88.88 mL/min (ref >60.00)
GLUCOSE: 92 mg/dL (ref 70–99)
Potassium: 5.1 mEq/L (ref 3.5–5.1)
Sodium: 136 mEq/L (ref 135–145)
Total Bilirubin: 0.6 mg/dL (ref 0.2–1.2)
Total Protein: 6.9 g/dL (ref 6.0–8.3)

## 2017-07-10 LAB — PSA, MEDICARE: PSA: 0.91 ng/mL (ref 0.10–4.00)

## 2017-07-10 LAB — LIPID PANEL
CHOL/HDL RATIO: 4
Cholesterol: 196 mg/dL (ref 0–200)
HDL: 49 mg/dL (ref >39.00)
LDL CALC: 134 mg/dL — AB (ref 0–99)
NONHDL: 147.07
TRIGLYCERIDES: 64 mg/dL (ref 0.0–149.0)
VLDL: 12.8 mg/dL (ref 0.0–40.0)

## 2017-07-10 LAB — TSH: TSH: 1.9 u[IU]/mL (ref 0.35–4.50)

## 2017-07-10 MED ORDER — LEVOTHYROXINE SODIUM 150 MCG PO TABS: 150.0000 ug | ORAL_TABLET | Freq: Every day | ORAL | 0 refills | Status: DC

## 2017-07-16 ENCOUNTER — Encounter: Payer: Self-pay | Admitting: Family Medicine

## 2017-07-16 NOTE — Progress Notes (Signed)
Message sent on MyChart to patient.

## 2017-07-16 NOTE — Telephone Encounter (Signed)
Pt given results per Dr Milinda CaveMcGowen, " Pls notify pt that all his blood work was normal except his cholesterol was elevated. I recommend pt start cholesterol-lowering therapy in order to try to decrease their risk of developing cardiovascular disease or stroke. If pt agreeable to this, pls eRx rosuvastatin 10mg , 1 tab po qd, #30, RF x 2.;Recheck FLP at CPE in 2-3 months.I'll eRx a 90d supply of his levothyroxine. Needs to make CPE appt for 2-3 months from now.-thx"; pt verbalizes states that he will call back to schedule appointment for CPE and labs; pt states that the best way to reac him is via text message [phone].

## 2017-08-11 IMAGING — DX DG CHEST 2V
2 series · 2 of 2 positions shown · non-contrast
Comparison: 07/28/2012.

CLINICAL DATA: Cough/Vignesh Marte 3 days. Hx of COPD. smoker.

EXAM:
CHEST  2 VIEW

[chest pa]
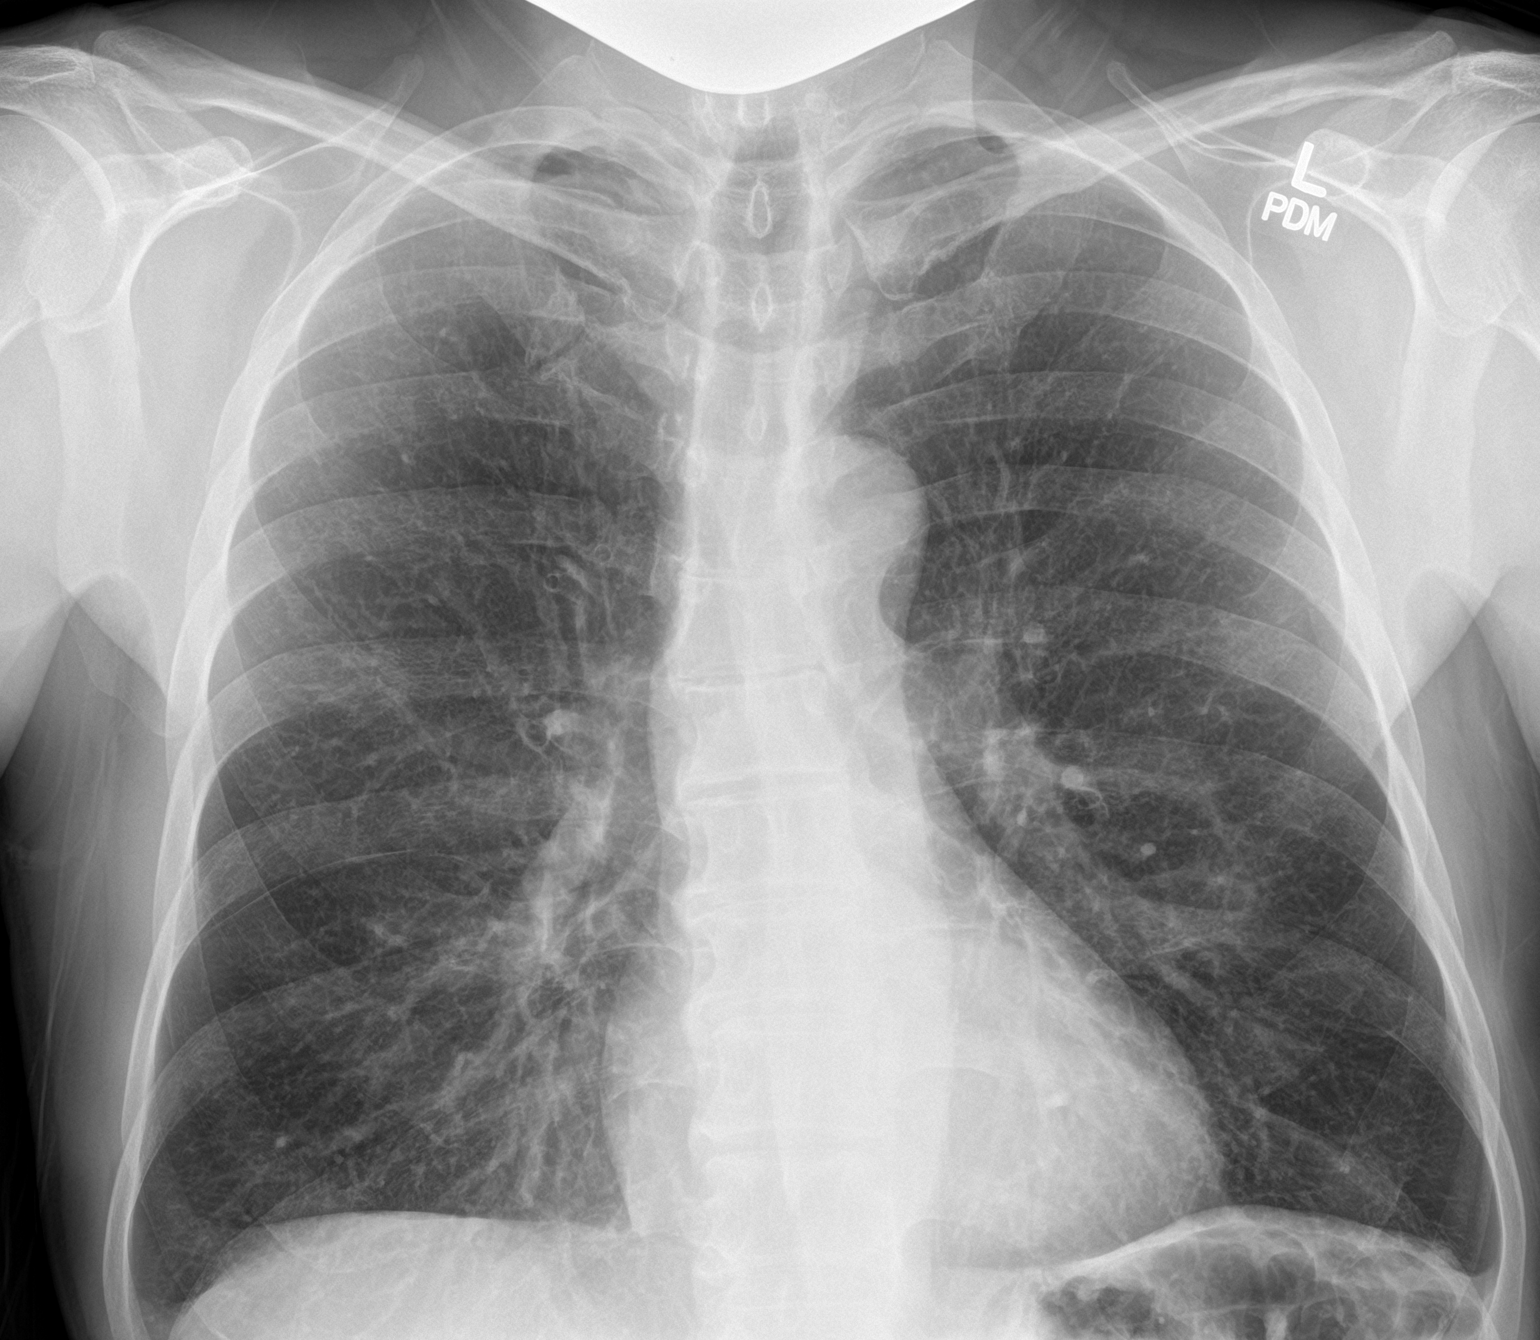

[chest lat]
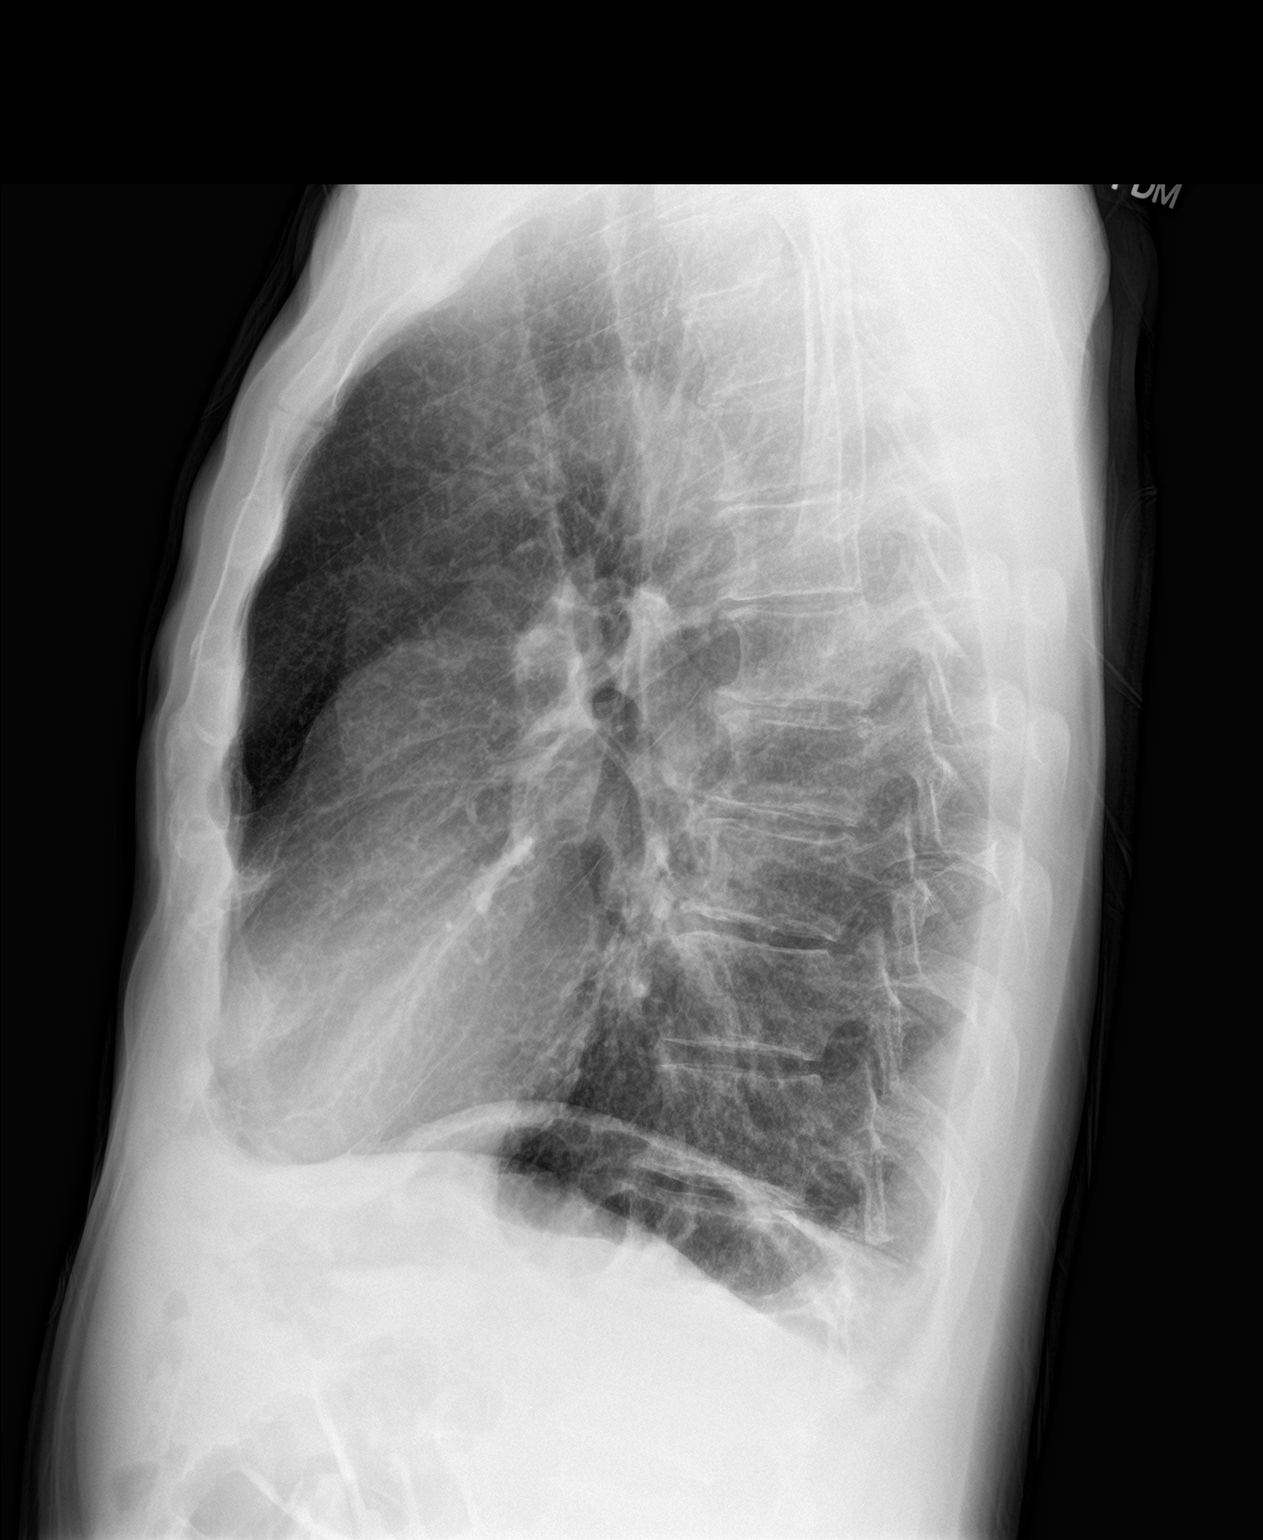

[2 of 2 positions shown; findings below may reference images not displayed]

FINDINGS: Heart size is normal. There is perihilar peribronchial thickening.
There are no focal consolidations. There is left costophrenic angle
blunting, consistent with small pleural effusion or pleural
thickening. No pulmonary edema.
IMPRESSION: 1. Bronchitic changes.
2.  No focal acute pulmonary abnormality.
3. Left pleural thickening or pleural effusion.

## 2017-10-31 ENCOUNTER — Telehealth: Payer: Self-pay | Admitting: Family Medicine

## 2017-10-31 ENCOUNTER — Other Ambulatory Visit: Payer: Self-pay | Admitting: *Deleted

## 2017-10-31 MED ORDER — LEVOTHYROXINE SODIUM 150 MCG PO TABS: 150.0000 ug | ORAL_TABLET | Freq: Every day | ORAL | 0 refills | Status: DC

## 2017-10-31 NOTE — Telephone Encounter (Signed)
Copied from CRM [id number]. Topic: Quick Communication - Rx Refill/Question >> Oct 31, 2017 11:41 AM Arlyss Gandy, NT wrote: Medication: levothyroxine (SYNTHROID, LEVOTHROID) 150 MCG tablet Has the patient contacted their pharmacy? Yes.   (Agent: If no, request that the patient contact the pharmacy for the refill.) Preferred Pharmacy (with phone number or street name): [facility] - [place], [place] - [address] [phone] (Phone) [phone] (Fax)     Agent: Please be advised that RX refills may take up to 3 business days. We ask that you follow-up with your pharmacy.

## 2017-10-31 NOTE — Telephone Encounter (Signed)
Rx refilled per protocol- LOV/lab 07/10/17  Patient has appointment 11/13/17

## 2017-11-13 ENCOUNTER — Ambulatory Visit: Payer: BLUE CROSS/BLUE SHIELD | Admitting: Family Medicine

## 2017-11-13 ENCOUNTER — Encounter: Payer: Self-pay | Admitting: Family Medicine

## 2017-11-13 VITALS — BP 132/87 | HR 80 | Resp 16 | Ht 72.0 in | Wt 166.0 lb

## 2017-11-13 DIAGNOSIS — Z9189 Other specified personal risk factors, not elsewhere classified: Secondary | ICD-10-CM

## 2017-11-13 DIAGNOSIS — E039 Hypothyroidism, unspecified: Secondary | ICD-10-CM

## 2017-11-13 DIAGNOSIS — Z1159 Encounter for screening for other viral diseases: Secondary | ICD-10-CM | POA: Diagnosis not present

## 2017-11-13 DIAGNOSIS — E7800 Pure hypercholesterolemia, unspecified: Secondary | ICD-10-CM | POA: Diagnosis not present

## 2017-11-13 NOTE — Progress Notes (Signed)
OFFICE VISIT  11/13/2017   CC:  Chief Complaint  Patient presents with  . Follow-up    RCI   HPI:    Patient is a 67 y.o. Caucasian male who presents for f/u hypothyroidism and hypercholesterolemia. After labs 06/2017 I recommended he start rosuvastatin and get repeat FLP in 2-3 mo at the time of CPE. He has not followed up. He feels well.  He only eats once a day---has lost 8 lbs since last f/u 6 mo ago. He has been taking the rosuvastatin w/out any side effects.  ROS: no fevers, no myalgias, no melena, no HAs, no palpitations, no dizziness, no HAs, no CP, no SOB.  Past Medical History:  Diagnosis Date  . Colon cancer screening    Pt has never had colonoscopy due to fear of prep; 2014 consult with Dr. Remigio Eisenmenger recommended he get colonoscopy and he agreed but never scheduled it.  [name] COPD (chronic obstructive pulmonary disease) ([facility])   . History of genital warts   . Inguinal hernia, without mention of obstruction or gangrene   . Pure hypercholesterolemia    he has been hesitant to start statin even though he has purchased it from pharmacy  . Seborrheic dermatitis, unspecified   . Tobacco use disorder   . Unspecified hearing loss   . Unspecified hypothyroidism   . Varicose veins of leg with pain, left    intermittently painful, wears compression hose.  The varicosities stemmed from vein injury near his knee during a rafting trip in the [place] past    Past Surgical History:  Procedure Laterality Date  . EXTERNAL EAR SURGERY     mastoid had nail in it, hit by nail gun  . TONSILLECTOMY      Outpatient Medications Prior to Visit  Medication Sig Dispense Refill  . clotrimazole-betamethasone (LOTRISONE) cream Apply topically 2 (two) times daily. 45 g 1  . levothyroxine (SYNTHROID, LEVOTHROID) 150 MCG tablet Take 1 tablet (150 mcg total) by mouth daily. 90 tablet 0  . rosuvastatin (CRESTOR) 10 MG tablet Take 1 tablet (10 mg total) by mouth daily. 90 tablet 1  . desonide  (DESOWEN) 0.05 % lotion Apply 1 application topically daily as needed.     No facility-administered medications prior to visit.     Allergies  Allergen Reactions  . Clindamycin/Lincomycin Itching  . Penicillins     REACTION: rash  . Amoxicillin Rash    ROS As per HPI  PE: Blood pressure 132/87, pulse 80, resp. rate 16, height 6' (1.829 m), weight 166 lb (75.3 kg), SpO2 95 %.  Body mass index is 22.51 kg/m. Gen: Alert, well appearing.  Patient is oriented to person, place, time, and situation. AFFECT: pleasant, lucid thought and speech. CV: RRR, no m/r/g.   LUNGS: CTA bilat, nonlabored resps, good aeration in all lung fields. EXT: no clubbing, cyanosis, or edema.    LABS:  Lab Results  Component Value Date   TSH 1.90 07/10/2017     Chemistry      Component Value Date/Time   NA 136 07/10/2017 0924   K 5.1 07/10/2017 0924   CL 102 07/10/2017 0924   CO2 30 07/10/2017 0924   BUN 16 07/10/2017 0924   CREATININE 0.91 07/10/2017 0924      Component Value Date/Time   CALCIUM 9.0 07/10/2017 0924   ALKPHOS 93 07/10/2017 0924   AST 12 07/10/2017 0924   ALT 9 07/10/2017 0924   BILITOT 0.6 07/10/2017 0924     Lab Results  Component Value Date   WBC 7.9 07/10/2017   HGB 15.5 07/10/2017   HCT 46.0 07/10/2017   MCV 91.3 07/10/2017   PLT 186.0 07/10/2017   Lab Results  Component Value Date   CHOL 196 07/10/2017   HDL 49.00 07/10/2017   LDLCALC 134 (H) 07/10/2017   LDLDIRECT 150.7 08/11/2012   TRIG 64.0 07/10/2017   CHOLHDL 4 07/10/2017     IMPRESSION AND PLAN:  1) Hypercholesterolemia:  Tolerating statin. He'll return when fasting in the next 1-2 wks for recheck FLP.  2) Hypothyroidism: he'll get TSH recheck when he returns for FLP lab visit.  3) Preventative health: Hep C screening ordered today--future.  An After Visit Summary was printed and given to the patient.  FOLLOW UP: Return in about 6 months (around 05/16/2018) for annual CPE  (fasting).  Signed:  Santiago Bumpers, MD           11/13/2017

## 2018-02-02 ENCOUNTER — Telehealth: Payer: Self-pay | Admitting: Family Medicine

## 2018-02-02 MED ORDER — LEVOTHYROXINE SODIUM 150 MCG PO TABS: 150.0000 ug | ORAL_TABLET | Freq: Every day | ORAL | 0 refills | Status: DC

## 2018-02-02 NOTE — Telephone Encounter (Signed)
Copied from CRM [id number]. Topic: Quick Communication - See Telephone Encounter >> Feb 02, 2018  4:56 PM Trula SladeWalter, Linda F wrote: CRM for notification. See Telephone encounter for: 02/02/18. Patient needs refill on the following prescription and sent to his preferred pharmacy [facility] [place] [place] on [address]  1) rosuvastatin (CRESTOR) 10 MG tablet 2) levothyroxine (SYNTHROID, LEVOTHROID) 150 MCG tablet

## 2018-02-02 NOTE — Telephone Encounter (Signed)
Refill of Crestor previously filled by Sharman CheekG. Calone  LRF 05/15/16  #90  1 refill  LOV 11/13/17 [place]   [facility] in [place], [address]

## 2018-02-03 ENCOUNTER — Other Ambulatory Visit (INDEPENDENT_AMBULATORY_CARE_PROVIDER_SITE_OTHER): Payer: BLUE CROSS/BLUE SHIELD

## 2018-02-03 DIAGNOSIS — E039 Hypothyroidism, unspecified: Secondary | ICD-10-CM

## 2018-02-03 DIAGNOSIS — E7800 Pure hypercholesterolemia, unspecified: Secondary | ICD-10-CM

## 2018-02-03 DIAGNOSIS — Z9189 Other specified personal risk factors, not elsewhere classified: Secondary | ICD-10-CM | POA: Diagnosis not present

## 2018-02-03 DIAGNOSIS — Z1159 Encounter for screening for other viral diseases: Secondary | ICD-10-CM

## 2018-02-03 LAB — TSH: TSH: 0.97 u[IU]/mL (ref 0.35–4.50)

## 2018-02-03 LAB — LIPID PANEL
CHOL/HDL RATIO: 4
Cholesterol: 187 mg/dL (ref 0–200)
HDL: 47.4 mg/dL (ref >39.00)
LDL CALC: 127 mg/dL — AB (ref 0–99)
NonHDL: 139.74
TRIGLYCERIDES: 63 mg/dL (ref 0.0–149.0)
VLDL: 12.6 mg/dL (ref 0.0–40.0)

## 2018-02-03 NOTE — Addendum Note (Signed)
Addended by: Smitty KnudsenSUTHERLAND, Rykin [address] K on: 02/03/2018 08:56 AM   Modules accepted: Orders

## 2018-02-03 NOTE — Telephone Encounter (Signed)
Pt has Lipid Panel drawn today, will wait for results.

## 2018-02-04 LAB — HEPATITIS C ANTIBODY
Hepatitis C Ab: NONREACTIVE
SIGNAL TO CUT-OFF: 0.02 (ref <1.00)

## 2018-02-04 MED ORDER — ROSUVASTATIN CALCIUM 10 MG PO TABS: 10.0000 mg | ORAL_TABLET | Freq: Every day | ORAL | 1 refills | Status: DC

## 2018-02-04 NOTE — Addendum Note (Signed)
Addended by: Smitty KnudsenSUTHERLAND, Ahsha Hinsley K on: 02/04/2018 09:41 AM   Modules accepted: Orders

## 2018-02-04 NOTE — Telephone Encounter (Signed)
Lab results are in but have not been reviewed by PCP. Looks like no changes will need to be made, will go ahead and send in refill.

## 2018-02-05 ENCOUNTER — Encounter: Payer: Self-pay | Admitting: *Deleted

## 2018-05-06 ENCOUNTER — Encounter: Payer: Self-pay | Admitting: *Deleted

## 2018-05-08 ENCOUNTER — Other Ambulatory Visit: Payer: Self-pay | Admitting: Family Medicine

## 2018-05-08 MED ORDER — LEVOTHYROXINE SODIUM 150 MCG PO TABS: 150.0000 ug | ORAL_TABLET | Freq: Every day | ORAL | 1 refills | Status: DC

## 2018-05-08 NOTE — Telephone Encounter (Signed)
Copied from CRM [phone]. Topic: Quick Communication - Rx Refill/Question >> May 08, 2018  1:12 PM Gaynelle AduPoole, Shalonda wrote: Medication: levothyroxine (SYNTHROID, LEVOTHROID) 150 MCG tablet  Has the patient contacted their pharmacy?yes- patient requested a 90 day supply   Preferred Pharmacy (with phone number or street name): [facility] - [place], [place] - [address] [phone] (Phone) [phone] (Fax)    Agent: Please be advised that RX refills may take up to 3 business days. We ask that you follow-up with your pharmacy.

## 2018-09-16 ENCOUNTER — Telehealth: Payer: Self-pay

## 2018-09-16 NOTE — Telephone Encounter (Signed)
Copied from CRM [id number]. Topic: General - Other >> Sep 14, 2018  5:00 PM Darletta Moll L wrote: Reason for CRM: Patient wants to know if he should come in to get a pneumonia vaccine? Saw it suggested in [place], ok to reply there or via phone.

## 2018-11-14 ENCOUNTER — Other Ambulatory Visit: Payer: Self-pay | Admitting: Family Medicine

## 2018-11-16 ENCOUNTER — Encounter: Payer: Self-pay | Admitting: Family Medicine

## 2018-12-17 ENCOUNTER — Ambulatory Visit: Payer: BLUE CROSS/BLUE SHIELD | Admitting: Family Medicine

## 2018-12-17 ENCOUNTER — Ambulatory Visit (INDEPENDENT_AMBULATORY_CARE_PROVIDER_SITE_OTHER): Payer: BC Managed Care – PPO | Admitting: Family Medicine

## 2018-12-17 ENCOUNTER — Encounter: Payer: Self-pay | Admitting: Family Medicine

## 2018-12-17 ENCOUNTER — Other Ambulatory Visit: Payer: Self-pay

## 2018-12-17 DIAGNOSIS — E7800 Pure hypercholesterolemia, unspecified: Secondary | ICD-10-CM | POA: Diagnosis not present

## 2018-12-17 DIAGNOSIS — E039 Hypothyroidism, unspecified: Secondary | ICD-10-CM

## 2018-12-17 MED ORDER — CLOTRIMAZOLE-BETAMETHASONE 1-0.05 % EX CREA: TOPICAL_CREAM | Freq: Two times a day (BID) | CUTANEOUS | 2 refills | Status: DC

## 2018-12-17 MED ORDER — LEVOTHYROXINE SODIUM 150 MCG PO TABS: ORAL_TABLET | ORAL | 1 refills | Status: DC

## 2018-12-17 NOTE — Progress Notes (Signed)
Virtual Visit via Video Note  I connected with pt on 12/17/18 at  4:00 PM EDT by a video enabled telemedicine application and verified that I am speaking with the correct person using two identifiers.  Location patient: home Location provider:work or home office Persons participating in the virtual visit: patient, provider  I discussed the limitations of evaluation and management by telemedicine and the availability of in person appointments. The patient expressed understanding and agreed to proceed.  Telemedicine visit is a necessity given the COVID-19 restrictions in place at the current time.  HPI: 68 y/o WM being seen today for f/u hypothyroidism and hypercholesterolemia. I last saw him for f/u 11/13/2017.  He got his most recent labs 02/03/18 and they were stable, no med or dose changes were made.  Interim hx: Has been doing ok. Compliant with generic levothyroxine 150 mg qd. Appetite is fair.  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  He goes all day w/out eating many days, just eats supper. He admits he doesn't drink fluids like he should. He has always been thin, has had trouble gaining wt since HS. Most recent wt was 168 lbs.  HLD: not taking his statin regularly.  He does have the medication. He has always been hesitant to take med for his high cholesterol.  ROS: no CP, no SOB, no wheezing, no cough, no dizziness, no HAs, no rashes, no melena/hematochezia.  No polyuria or polydipsia.  No myalgias or arthralgias.   Past Medical History:  Diagnosis Date  . Colon cancer screening    Pt has never had colonoscopy due to fear of prep; 2014 consult with Dr. Garfield Cornea recommended he get colonoscopy and he agreed but never scheduled it.  [name] COPD (chronic obstructive pulmonary disease) ([place])   . History of genital warts   . Inguinal hernia, without mention of obstruction or gangrene   . Pure hypercholesterolemia    he has been hesitant to start statin even though he has  purchased it from pharmacy  . Seborrheic dermatitis, unspecified   . Tobacco use disorder   . Unspecified hearing loss   . Unspecified hypothyroidism   . Varicose veins of leg with pain, left    intermittently painful, wears compression hose.  The varicosities stemmed from vein injury near his knee during a rafting trip in the REMOTE past    Past Surgical History:  Procedure Laterality Date  . EXTERNAL EAR SURGERY     mastoid had nail in it, hit by nail gun  . TONSILLECTOMY      Family History  Problem Relation Age of Onset  . Thyroid disease Father   . Bipolar disorder Father   . Leukemia Mother        CLL      Current Outpatient Medications:  .  clotrimazole-betamethasone (LOTRISONE) cream, Apply topically 2 (two) times daily., Disp: 45 g, Rfl: 1 .  levothyroxine (SYNTHROID) 150 MCG tablet, TAKE (1) TABLET BY MOUTH ONCE A DAY., Disp: 30 tablet, Rfl: 0 .  rosuvastatin (CRESTOR) 10 MG tablet, Take 1 tablet (10 mg total) by mouth daily., Disp: 90 tablet, Rfl: 1 .  desonide (DESOWEN) 0.05 % lotion, Apply 1 application topically daily as needed., Disp: , Rfl:   EXAM:  VITALS per patient if applicable: There were no vitals taken for this visit.   GENERAL: alert, oriented, appears well and in no acute distress  HEENT: atraumatic, conjunttiva clear, no obvious abnormalities on inspection of external nose and ears  NECK:  normal movements of the head and neck  LUNGS: on inspection no signs of respiratory distress, breathing rate appears normal, no obvious gross SOB, gasping or wheezing  CV: no obvious cyanosis  MS: moves all visible extremities without noticeable abnormality  PSYCH/NEURO: pleasant and cooperative, no obvious depression or anxiety, speech and thought processing grossly intact  LABS: none today  Lab Results  Component Value Date   TSH 0.97 02/03/2018     Chemistry      Component Value Date/Time   NA 136 07/10/2017 0924   K 5.1 07/10/2017 0924   CL  102 07/10/2017 0924   CO2 30 07/10/2017 0924   BUN 16 07/10/2017 0924   CREATININE 0.91 07/10/2017 0924      Component Value Date/Time   CALCIUM 9.0 07/10/2017 0924   ALKPHOS 93 07/10/2017 0924   AST 12 07/10/2017 0924   ALT 9 07/10/2017 0924   BILITOT 0.6 07/10/2017 0924     Lab Results  Component Value Date   CHOL 187 02/03/2018   HDL 47.40 02/03/2018   LDLCALC 127 (H) 02/03/2018   LDLDIRECT 150.7 08/11/2012   TRIG 63.0 02/03/2018   CHOLHDL 4 02/03/2018    ASSESSMENT AND PLAN:  Discussed the following assessment and plan:  1) Hypothyroidism: Continue levothyroxine 150 mcg qd. TSH ordered for [facility] draw station.  2) Hypercholesterolemia: needs to restart taking his statin. FLP and CMET--future.   3) Preventative health: he'll make nurse appt to get pneumovax 23.  I discussed the assessment and treatment plan with the patient. The patient was provided an opportunity to ask questions and all were answered. The patient agreed with the plan and demonstrated an understanding of the instructions.   The patient was advised to call back or seek an in-person evaluation if the symptoms worsen or if the condition fails to improve as anticipated.  Spent 30 min with pt today, with >50% of this time spent in counseling and care coordination regarding the above problems.  F/u: 6 mo CPE  Signed:  Santiago BumpersPhil Demian Maisel, MD           12/17/2018

## 2018-12-17 NOTE — Addendum Note (Signed)
Addended by: Tammi Sou on: 12/17/2018 04:42 PM   Modules accepted: Orders

## 2018-12-24 ENCOUNTER — Ambulatory Visit: Payer: BC Managed Care – PPO

## 2018-12-24 ENCOUNTER — Telehealth: Payer: Self-pay | Admitting: Family Medicine

## 2018-12-24 NOTE — Telephone Encounter (Signed)
Patient is calling to ask advice on he pneumia vaccine. He read that according to the CDC patient 99 and older should get the [place] vaccine 13 first.  Patient is wondering why he is not getting this first. Please advise CB- [phone] and he has access to [facility] for a response. Please advise

## 2018-12-24 NOTE — Telephone Encounter (Signed)
Sent to [employer]

## 2018-12-25 NOTE — Telephone Encounter (Signed)
LM for pt to return call. If patient calls back, please advise of PCP recommendations.

## 2018-12-25 NOTE — Telephone Encounter (Signed)
Please advise, thanks.

## 2018-12-25 NOTE — Telephone Encounter (Signed)
That recommendation has changed as of the last 3 months or so. Pneumovax 23 is recommended to be given after age 66.  Then 1 yr later, the patient and doctor can discuss whether or not to even get the prevnar 13 vaccine at all.  This is b/c the immune response from prevnar 13 being given first was not as strong as the CDC expected when they first put out there recommendation.--thx

## 2018-12-31 NOTE — Telephone Encounter (Signed)
MyChart message sent with PCP recommendations.

## 2018-12-31 NOTE — Telephone Encounter (Signed)
MyChart message read.

## 2019-02-15 ENCOUNTER — Other Ambulatory Visit: Payer: Self-pay

## 2019-02-15 ENCOUNTER — Ambulatory Visit (INDEPENDENT_AMBULATORY_CARE_PROVIDER_SITE_OTHER): Payer: BC Managed Care – PPO | Admitting: Family Medicine

## 2019-02-15 DIAGNOSIS — Z23 Encounter for immunization: Secondary | ICD-10-CM

## 2019-02-15 NOTE — Progress Notes (Signed)
Patient presented to office for pneumovax 23.  Okay per 12/24/2018 telephone encounter.

## 2019-06-17 ENCOUNTER — Other Ambulatory Visit (INDEPENDENT_AMBULATORY_CARE_PROVIDER_SITE_OTHER): Payer: BC Managed Care – PPO

## 2019-06-17 DIAGNOSIS — E039 Hypothyroidism, unspecified: Secondary | ICD-10-CM | POA: Diagnosis not present

## 2019-06-17 DIAGNOSIS — E7800 Pure hypercholesterolemia, unspecified: Secondary | ICD-10-CM | POA: Diagnosis not present

## 2019-06-17 LAB — COMPREHENSIVE METABOLIC PANEL
ALT: 14 U/L (ref 0–53)
AST: 16 U/L (ref 0–37)
Albumin: 4.3 g/dL (ref 3.5–5.2)
Alkaline Phosphatase: 92 U/L (ref 39–117)
BUN: 12 mg/dL (ref 6–23)
CO2: 30 mEq/L (ref 19–32)
Calcium: 9.5 mg/dL (ref 8.4–10.5)
Chloride: 103 mEq/L (ref 96–112)
Creatinine, Ser: 0.98 mg/dL (ref 0.40–1.50)
GFR: 76.31 mL/min (ref >60.00)
Glucose, Bld: 96 mg/dL (ref 70–99)
Potassium: 5.5 mEq/L — ABNORMAL HIGH (ref 3.5–5.1)
Sodium: 138 mEq/L (ref 135–145)
Total Bilirubin: 0.4 mg/dL (ref 0.2–1.2)
Total Protein: 7.5 g/dL (ref 6.0–8.3)

## 2019-06-17 LAB — LIPID PANEL
Cholesterol: 194 mg/dL (ref 0–200)
HDL: 54.9 mg/dL (ref >39.00)
LDL Cholesterol: 129 mg/dL — ABNORMAL HIGH (ref 0–99)
NonHDL: 139.07
Total CHOL/HDL Ratio: 4
Triglycerides: 48 mg/dL (ref 0.0–149.0)
VLDL: 9.6 mg/dL (ref 0.0–40.0)

## 2019-06-17 LAB — TSH: TSH: 2.08 u[IU]/mL (ref 0.35–4.50)

## 2019-06-20 ENCOUNTER — Other Ambulatory Visit: Payer: Self-pay | Admitting: Family Medicine

## 2019-06-20 DIAGNOSIS — E7800 Pure hypercholesterolemia, unspecified: Secondary | ICD-10-CM

## 2019-06-20 DIAGNOSIS — E875 Hyperkalemia: Secondary | ICD-10-CM

## 2019-06-29 ENCOUNTER — Other Ambulatory Visit: Payer: Self-pay | Admitting: Family Medicine

## 2019-07-22 ENCOUNTER — Ambulatory Visit (INDEPENDENT_AMBULATORY_CARE_PROVIDER_SITE_OTHER): Payer: BC Managed Care – PPO | Admitting: Family Medicine

## 2019-07-22 ENCOUNTER — Other Ambulatory Visit: Payer: Self-pay

## 2019-07-22 DIAGNOSIS — E7800 Pure hypercholesterolemia, unspecified: Secondary | ICD-10-CM

## 2019-07-22 DIAGNOSIS — E875 Hyperkalemia: Secondary | ICD-10-CM | POA: Diagnosis not present

## 2019-07-22 LAB — BASIC METABOLIC PANEL
BUN: 15 mg/dL (ref 6–23)
CO2: 28 mEq/L (ref 19–32)
Calcium: 9.3 mg/dL (ref 8.4–10.5)
Chloride: 103 mEq/L (ref 96–112)
Creatinine, Ser: 0.96 mg/dL (ref 0.40–1.50)
GFR: 78.13 mL/min (ref >60.00)
Glucose, Bld: 87 mg/dL (ref 70–99)
Potassium: 4.3 mEq/L (ref 3.5–5.1)
Sodium: 139 mEq/L (ref 135–145)

## 2019-07-22 MED ORDER — LEVOTHYROXINE SODIUM 150 MCG PO TABS: ORAL_TABLET | ORAL | 0 refills | Status: DC

## 2019-08-12 ENCOUNTER — Ambulatory Visit: Payer: BC Managed Care – PPO | Admitting: Family Medicine

## 2019-08-12 ENCOUNTER — Encounter: Payer: Self-pay | Admitting: Family Medicine

## 2019-08-12 ENCOUNTER — Other Ambulatory Visit: Payer: Self-pay

## 2019-08-12 VITALS — BP 146/78 | HR 84 | Temp 98.1°F | Ht 72.0 in | Wt 168.0 lb

## 2019-08-12 DIAGNOSIS — Z125 Encounter for screening for malignant neoplasm of prostate: Secondary | ICD-10-CM | POA: Diagnosis not present

## 2019-08-12 DIAGNOSIS — E7800 Pure hypercholesterolemia, unspecified: Secondary | ICD-10-CM | POA: Diagnosis not present

## 2019-08-12 DIAGNOSIS — E039 Hypothyroidism, unspecified: Secondary | ICD-10-CM | POA: Diagnosis not present

## 2019-08-12 DIAGNOSIS — Z0000 Encounter for general adult medical examination without abnormal findings: Secondary | ICD-10-CM

## 2019-08-12 MED ORDER — LEVOTHYROXINE SODIUM 150 MCG PO TABS: ORAL_TABLET | ORAL | 3 refills | Status: DC

## 2019-08-12 MED ORDER — DESONIDE 0.05 % EX LOTN: 1.0000 "application " | TOPICAL_LOTION | Freq: Every day | CUTANEOUS | 3 refills | Status: DC | PRN

## 2019-08-12 MED ORDER — ROSUVASTATIN CALCIUM 10 MG PO TABS: 10.0000 mg | ORAL_TABLET | Freq: Every day | ORAL | 3 refills | Status: DC

## 2019-08-12 MED ORDER — CLOTRIMAZOLE-BETAMETHASONE 1-0.05 % EX CREA: TOPICAL_CREAM | Freq: Two times a day (BID) | CUTANEOUS | 2 refills | Status: DC

## 2019-08-12 NOTE — Progress Notes (Signed)
OFFICE VISIT  08/12/2019   CC: f/u chronic illness  HPI:    Patient is a 69 y.o. Caucasian male who presents for f/u hypercholesterolemia and hypothyroidism. I last saw him 12/17/18. A/P as of that visit: "1) Hypothyroidism: Continue levothyroxine 150 mcg qd. TSH ordered for [facility] draw station.  2) Hypercholesterolemia: needs to restart taking his statin. FLP and CMET--future.  3) Preventative health: he'll make nurse appt to get pneumovax 23."  Interim hx: Feeling well. Active at work, lots of lifting and walking.  No formal exercise regimen. Just started his statin last week, historically has not been comfortable with the thought of taking this med---see pMH section. No side effects at this time. Needs f/u lipids plus hepatic panel in 3 mo.  Still smoking cigars, trying to cut back.  No cigs since 2013. Discussed lung ca screening: he wants to think about it and discuss again at future visit. He is agreeable to PSA for prostate cancer screening. He won't get colonoscopy or other colon ca screening.  ROS: no fevers, no CP, no SOB, no wheezing, no cough, no dizziness, no HAs, no rashes, no melena/hematochezia.  No polyuria or polydipsia.  No myalgias or arthralgias.  Past Medical History:  Diagnosis Date  . Colon cancer screening    Pt has never had colonoscopy due to fear of prep; 2014 consult with Dr. Remigio Eisenmenger recommended he get colonoscopy and he agreed but never scheduled it.  [name] COPD (chronic obstructive pulmonary disease) ([facility])   . History of genital warts   . Inguinal hernia, without mention of obstruction or gangrene   . Pure hypercholesterolemia    he has been hesitant to start statin even though he has purchased it from pharmacy  . Seborrheic dermatitis, unspecified   . Tobacco use disorder   . Unspecified hearing loss   . Unspecified hypothyroidism   . Varicose veins of leg with pain, left    intermittently painful, wears compression hose.  The varicosities  stemmed from vein injury near his knee during a rafting trip in the REMOTE past    Past Surgical History:  Procedure Laterality Date  . EXTERNAL EAR SURGERY     mastoid had nail in it, hit by nail gun  . TONSILLECTOMY     Social History   Socioeconomic History  . Marital status: Married    Spouse name: karon  . Number of children: 2  . Years of education: Not on file  . Highest education level: Not on file  Occupational History  . Occupation: Engineer, manufacturing systems    Comment: parts delivery  Tobacco Use  . Smoking status: Current Every Day Smoker    Types: Cigarettes, Cigars    Last attempt to quit: 11/23/2012    Years since quitting: 6.7  . Smokeless tobacco: Former Neurosurgeon    Types: Chew  . Tobacco comment: 1 ppd  Substance and Sexual Activity  . Alcohol use: No  . Drug use: No  . Sexual activity: Not on file  Other Topics Concern  . Not on file  Social History Narrative   Married, 1 daughter and two sons.   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   Tob: cigars as of 2019.     Cigarettes: 50 pack-yr hx, quit 2013.   Alcohol: none   Social Determinants of Health   Financial Resource Strain:   . Difficulty of Paying Living Expenses: Not on file  Food Insecurity:   . Worried About Running  Out of Food in the Last Year: Not on file  . Ran Out of Food in the Last Year: Not on file  Transportation Needs:   . Lack of Transportation (Medical): Not on file  . Lack of Transportation (Non-Medical): Not on file  Physical Activity:   . Days of Exercise per Week: Not on file  . Minutes of Exercise per Session: Not on file  Stress:   . Feeling of Stress : Not on file  Social Connections:   . Frequency of Communication with Friends and Family: Not on file  . Frequency of Social Gatherings with Friends and Family: Not on file  . Attends Religious Services: Not on file  . Active Member of Clubs or Organizations: Not on file  . Attends Tax inspector Meetings: Not on file  . Marital Status: Not on file    Outpatient Medications Prior to Visit  Medication Sig Dispense Refill  . clotrimazole-betamethasone (LOTRISONE) cream Apply topically 2 (two) times daily. 45 g 2  . desonide (DESOWEN) 0.05 % lotion Apply 1 application topically daily as needed.    [name] levothyroxine (SYNTHROID) 150 MCG tablet Take (1) tablet daily 30 tablet 0  . rosuvastatin (CRESTOR) 10 MG tablet Take 1 tablet (10 mg total) by mouth daily. 90 tablet 1   No facility-administered medications prior to visit.    Allergies  Allergen Reactions  . Clindamycin/Lincomycin Itching  . Penicillins     REACTION: rash  . Amoxicillin Rash    ROS As per HPI  PE: Blood pressure (!) 146/78, pulse 84, temperature 98.1 F (36.7 C), temperature source Oral, height 6' (1.829 m), weight 168 lb (76.2 kg), SpO2 96 %. Body mass index is 22.78 kg/m.  Gen: Alert, well appearing.  Patient is oriented to person, place, time, and situation. AFFECT: pleasant, lucid thought and speech. NOI:BBCW: no injection, icteris, swelling, or exudate.  EOMI, PERRLA. Mouth: lips without lesion/swelling.  Oral mucosa pink and moist. Oropharynx without erythema, exudate, or swelling.  Neck - No masses or thyromegaly or limitation in range of motion CV: RRR, no m/r/g.   LUNGS: CTA bilat, nonlabored resps, good aeration in all lung fields. ABD: soft, NT/ND EXT: no clubbing or cyanosis.  no edema.    LABS:  Lab Results  Component Value Date   TSH 2.08 06/17/2019   Lab Results  Component Value Date   WBC 7.9 07/10/2017   HGB 15.5 07/10/2017   HCT 46.0 07/10/2017   MCV 91.3 07/10/2017   PLT 186.0 07/10/2017   Lab Results  Component Value Date   CREATININE 0.96 07/22/2019   BUN 15 07/22/2019   NA 139 07/22/2019   K 4.3 07/22/2019   CL 103 07/22/2019   CO2 28 07/22/2019   Lab Results  Component Value Date   ALT 14 06/17/2019   AST 16 06/17/2019   ALKPHOS 92 06/17/2019    BILITOT 0.4 06/17/2019   Lab Results  Component Value Date   CHOL 194 06/17/2019   Lab Results  Component Value Date   HDL 54.90 06/17/2019   Lab Results  Component Value Date   LDLCALC 129 (H) 06/17/2019   Lab Results  Component Value Date   TRIG 48.0 06/17/2019   Lab Results  Component Value Date   CHOLHDL 4 06/17/2019   Lab Results  Component Value Date   PSA 0.91 07/10/2017   PSA 0.92 08/11/2012   PSA 0.98 01/01/2011    IMPRESSION AND PLAN:  1) Hypothyroidism: TSH 05/2019  wnl. Continue current T4 dose, plan recheck TSH at next lab draw in 3 mo.  2) Hypercholesterolemia: just started statin last week, tolerating fine so far. Plan rpt lipids and hepatic panel 44mo.  3) Preventative health:  Prostate ca screening: check PSA with next labs in 3 mo. Colon ca screening: pt declines. Lung ca screening: pt qualifies for screening but wants to defer for now. Vaccines: Tdap due.  Shingrix : has not had.  WE'll readdress at future visits.  FLP, hepatic panel, TSH, PSA ordered future (3 mo).  An After Visit Summary was printed and given to the patient.  FOLLOW UP: Return in about 6 months (around 02/09/2020) for routine chronic illness f/u in person;  needs lab appt for fasting blood work in 3 months.  Signed:  Crissie Sickles, MD           08/12/2019

## 2020-03-31 ENCOUNTER — Telehealth: Payer: Self-pay

## 2020-03-31 ENCOUNTER — Other Ambulatory Visit: Payer: Self-pay | Admitting: Family Medicine

## 2020-03-31 NOTE — Telephone Encounter (Signed)
Pt called stating he needs refills on meds.  I notified pt he needed to do a lab visit since his last appt in Feb (that was to be in three months following that OV) and was due back to see his PCP in Aug for a f/up visit.  Pt went on to say he was going to be out of town and would not be able to come in for labs or a visit until sometime in Nov.  I had to put pt on hold to check out a few pts while I was by myself and he hung up the call.  I called pt back and LMOVM apologizing for the hold and asking him to please call the office back to schedule the appts and complete the request for the medications he needed.

## 2020-04-20 ENCOUNTER — Other Ambulatory Visit: Payer: Self-pay

## 2020-04-20 ENCOUNTER — Ambulatory Visit (INDEPENDENT_AMBULATORY_CARE_PROVIDER_SITE_OTHER): Payer: BC Managed Care – PPO

## 2020-04-20 DIAGNOSIS — E039 Hypothyroidism, unspecified: Secondary | ICD-10-CM

## 2020-04-20 DIAGNOSIS — E7800 Pure hypercholesterolemia, unspecified: Secondary | ICD-10-CM

## 2020-04-20 DIAGNOSIS — Z125 Encounter for screening for malignant neoplasm of prostate: Secondary | ICD-10-CM

## 2020-04-20 LAB — HEPATIC FUNCTION PANEL
ALT: 12 U/L (ref 0–53)
AST: 16 U/L (ref 0–37)
Albumin: 4.4 g/dL (ref 3.5–5.2)
Alkaline Phosphatase: 96 U/L (ref 39–117)
Bilirubin, Direct: 0.1 mg/dL (ref 0.0–0.3)
Total Bilirubin: 0.5 mg/dL (ref 0.2–1.2)
Total Protein: 7.3 g/dL (ref 6.0–8.3)

## 2020-04-20 LAB — LIPID PANEL
Cholesterol: 146 mg/dL (ref 0–200)
HDL: 56.9 mg/dL (ref >39.00)
LDL Cholesterol: 78 mg/dL (ref 0–99)
NonHDL: 89.41
Total CHOL/HDL Ratio: 3
Triglycerides: 59 mg/dL (ref 0.0–149.0)
VLDL: 11.8 mg/dL (ref 0.0–40.0)

## 2020-04-20 LAB — TSH: TSH: 4.79 u[IU]/mL — ABNORMAL HIGH (ref 0.35–4.50)

## 2020-04-20 LAB — PSA: PSA: 1.28 ng/mL (ref 0.10–4.00)

## 2020-04-21 ENCOUNTER — Telehealth: Payer: Self-pay

## 2020-04-21 NOTE — Telephone Encounter (Signed)
-----   Message from Jeoffrey Massed, MD sent at 04/20/2020  4:37 PM EDT ----- Please notify: all labs came back normal. No new recommendations.

## 2020-04-21 NOTE — Telephone Encounter (Signed)
TSH level not concerning but it is best if he can consistently take the thyroid med on empty stomach w/out any other meds at the same time. We'll keep checking thyroid testing regularly.-thx

## 2020-04-21 NOTE — Telephone Encounter (Signed)
Spoke with pt regarding labs and instructions. Pt questions TSH levels. Pt states that he has taken rx a few times with food on stomach. Please advise

## 2020-04-21 NOTE — Telephone Encounter (Signed)
Patient advised and voiced understanding.  

## 2020-04-27 ENCOUNTER — Other Ambulatory Visit: Payer: Self-pay

## 2020-04-27 ENCOUNTER — Ambulatory Visit: Payer: BC Managed Care – PPO | Admitting: Family Medicine

## 2020-04-27 ENCOUNTER — Encounter: Payer: Self-pay | Admitting: Family Medicine

## 2020-04-27 VITALS — BP 138/83 | HR 77 | Temp 97.8°F | Ht 72.0 in | Wt 160.0 lb

## 2020-04-27 DIAGNOSIS — Z23 Encounter for immunization: Secondary | ICD-10-CM

## 2020-04-27 DIAGNOSIS — E7800 Pure hypercholesterolemia, unspecified: Secondary | ICD-10-CM

## 2020-04-27 DIAGNOSIS — E039 Hypothyroidism, unspecified: Secondary | ICD-10-CM

## 2020-04-27 DIAGNOSIS — Z0000 Encounter for general adult medical examination without abnormal findings: Secondary | ICD-10-CM

## 2020-04-27 MED ORDER — LEVOTHYROXINE SODIUM 150 MCG PO TABS
ORAL_TABLET | ORAL | 1 refills | Status: DC
Start: 2020-04-27 — End: 2020-10-26

## 2020-04-27 MED ORDER — ZOSTER VAC RECOMB ADJUVANTED 50 MCG/0.5ML IM SUSR: 0.5000 mL | Freq: Once | INTRAMUSCULAR | 1 refills | Status: AC

## 2020-04-27 MED ORDER — DESONIDE 0.05 % EX LOTN: 1.0000 "application " | TOPICAL_LOTION | Freq: Every day | CUTANEOUS | 1 refills | Status: DC | PRN

## 2020-04-27 MED ORDER — ROSUVASTATIN CALCIUM 10 MG PO TABS
10.0000 mg | ORAL_TABLET | Freq: Every day | ORAL | 1 refills | Status: DC
Start: 2020-04-27 — End: 2020-10-26

## 2020-04-27 MED ORDER — TETANUS-DIPHTH-ACELL PERTUSSIS 5-2.5-18.5 LF-MCG/0.5 IM SUSP: 0.5000 mL | Freq: Once | INTRAMUSCULAR | 0 refills | Status: AC

## 2020-04-27 MED ORDER — CLOTRIMAZOLE-BETAMETHASONE 1-0.05 % EX CREA
TOPICAL_CREAM | Freq: Two times a day (BID) | CUTANEOUS | 1 refills | Status: DC
Start: 2020-04-27 — End: 2022-11-28

## 2020-04-27 NOTE — Progress Notes (Signed)
OFFICE VISIT  04/27/2020  CC:  Chief Complaint  Patient presents with  . Follow-up    [facility]    HPI:    Patient is a 69 y.o. Caucasian male who presents for 6 mo f/u HLD and hypothyroidism. A/P as of last visit: "1) Hypothyroidism: TSH 05/2019 wnl. Continue current T4 dose, plan recheck TSH at next lab draw in 3 mo.  2) Hypercholesterolemia: just started statin last week, tolerating fine so far. Plan rpt lipids and hepatic panel 29mo.  3) Preventative health:  Prostate ca screening: check PSA with next labs in 3 mo. Colon ca screening: pt declines. Lung ca screening: pt qualifies for screening but wants to defer for now. Vaccines: Tdap due.  Shingrix : has not had.  WE'll readdress at future visits.  FLP, hepatic panel, TSH, PSA ordered future (3 mo)."   INTERIM HX: Recent labs 04/20/20 reviewed in detail with pt today: lipids great, hepatic panel normal, PSA normal, TSH ULN---no changes recommended.  He has been taking crestor only about 20d now. Tolerating this med fine.  Hypoth: tends to take med correctly about 50% of the time approx. Trying lately to make sure he takes it on empty stomach every morning.  Still smoking cigars but no cigarettes in the last 8 yrs or so.  ROS: no fevers, no CP, no SOB, no wheezing, no cough, no dizziness, no HAs, no rashes, no melena/hematochezia.  No polyuria or polydipsia.  No myalgias or arthralgias.  No focal weakness, paresthesias, or tremors.  No acute vision or hearing abnormalities. No n/v/d or abd pain.  No palpitations.     Past Medical History:  Diagnosis Date  . Colon cancer screening    Pt has never had colonoscopy due to fear of prep; 2014 consult with Dr. Remigio Eisenmenger recommended he get colonoscopy and he agreed but never scheduled it.  [name] COPD (chronic obstructive pulmonary disease) ([facility])   . History of genital warts   . Inguinal hernia, without mention of obstruction or gangrene   . Pure hypercholesterolemia    he has  been hesitant to start statin even though he has purchased it from pharmacy  . Seborrheic dermatitis, unspecified   . Tobacco use disorder   . Unspecified hearing loss   . Unspecified hypothyroidism   . Varicose veins of leg with pain, left    intermittently painful, wears compression hose.  The varicosities stemmed from vein injury near his knee during a rafting trip in the [place] past    Past Surgical History:  Procedure Laterality Date  . EXTERNAL EAR SURGERY     mastoid had nail in it, hit by nail gun  . TONSILLECTOMY      Outpatient Medications Prior to Visit  Medication Sig Dispense Refill  . clotrimazole-betamethasone (LOTRISONE) cream Apply topically 2 (two) times daily. 45 g 2  . desonide (DESOWEN) 0.05 % lotion Apply 1 application topically daily as needed. 59 mL 3  . levothyroxine (SYNTHROID) 150 MCG tablet TAKE 1 TABLET BY MOUTH EVERY DAY 30 tablet 0  . rosuvastatin (CRESTOR) 10 MG tablet Take 1 tablet (10 mg total) by mouth daily. 90 tablet 3   No facility-administered medications prior to visit.    Allergies  Allergen Reactions  . Clindamycin/Lincomycin Itching  . Penicillins     REACTION: rash  . Amoxicillin Rash    ROS As per HPI  PE: Vitals with BMI 04/27/2020 08/12/2019 11/13/2017  Height 6\' 0"  6\' 0"  6\' 0"   Weight 160 lbs 168  lbs 166 lbs  BMI 21.7 22.78 22.51  Systolic 138 146 272  Diastolic 83 78 87  Pulse 77 84 80     Gen: Alert, well appearing.  Patient is oriented to person, place, time, and situation. AFFECT: pleasant, lucid thought and speech. CV: RRR, no m/r/g.   LUNGS: CTA bilat, nonlabored resps, good aeration in all lung fields. EXT: no clubbing or cyanosis.  no edema.    LABS:  Lab Results  Component Value Date   TSH 4.79 (H) 04/20/2020   Lab Results  Component Value Date   WBC 7.9 07/10/2017   HGB 15.5 07/10/2017   HCT 46.0 07/10/2017   MCV 91.3 07/10/2017   PLT 186.0 07/10/2017   Lab Results  Component Value Date    CREATININE 0.96 07/22/2019   BUN 15 07/22/2019   NA 139 07/22/2019   K 4.3 07/22/2019   CL 103 07/22/2019   CO2 28 07/22/2019   Lab Results  Component Value Date   ALT 12 04/20/2020   AST 16 04/20/2020   ALKPHOS 96 04/20/2020   BILITOT 0.5 04/20/2020   Lab Results  Component Value Date   CHOL 146 04/20/2020   Lab Results  Component Value Date   HDL 56.90 04/20/2020   Lab Results  Component Value Date   LDLCALC 78 04/20/2020   Lab Results  Component Value Date   TRIG 59.0 04/20/2020   Lab Results  Component Value Date   CHOLHDL 3 04/20/2020   Lab Results  Component Value Date   PSA 1.28 04/20/2020   PSA 0.91 07/10/2017   PSA 0.92 08/11/2012    IMPRESSION AND PLAN:  1) HLD: tolerating statin, which he has been on for about 3 wks. Lipids about 2 wks after starting med were pretty darn good--LDL 78, HDL 57, trigs 59. Cont rosuva 10mg  qd and recheck lipids at f/u in 6 mo.  2) Hypothyroidism: TSH stable, needs to try to take T4 correctly more consistently. Cont 150 mcg T4 qd. Recheck TSH 6 mo.  3) Prev health: He qualifies for, but declines lung ca screening. Vaccines due: shingrix, Tdap-->rx's sent to pharmacy today. He got flu vaccine here today. He has had moderna covid vaccine x 2 and is due for booster.  An After Visit Summary was printed and given to the patient.  FOLLOW UP: Return in about 6 months (around 10/25/2020) for annual CPE (fasting).  Signed:  12/25/2020, MD           04/27/2020

## 2020-05-02 ENCOUNTER — Telehealth: Payer: Self-pay

## 2020-05-02 NOTE — Telephone Encounter (Signed)
PA sent via covermymed on    Key: [id number]   Medication: Desonide 0.05% lotion   Dx: L21.9, Seborrheic dermatitis, unspecified   Per Dr. Milinda Cave pt has tried and failed    CaseId:[id number];Status:Approved;Review Type:Prior Auth;Coverage Start Date:04/02/2020;Coverage End Date:05/02/2021;

## 2020-10-26 ENCOUNTER — Ambulatory Visit (INDEPENDENT_AMBULATORY_CARE_PROVIDER_SITE_OTHER): Payer: BC Managed Care – PPO | Admitting: Family Medicine

## 2020-10-26 ENCOUNTER — Other Ambulatory Visit: Payer: Self-pay

## 2020-10-26 ENCOUNTER — Encounter: Payer: Self-pay | Admitting: Family Medicine

## 2020-10-26 VITALS — BP 114/78 | HR 79 | Temp 97.7°F | Resp 16 | Ht 71.0 in | Wt 163.8 lb

## 2020-10-26 DIAGNOSIS — Z1211 Encounter for screening for malignant neoplasm of colon: Secondary | ICD-10-CM

## 2020-10-26 DIAGNOSIS — Z87891 Personal history of nicotine dependence: Secondary | ICD-10-CM

## 2020-10-26 DIAGNOSIS — Z0000 Encounter for general adult medical examination without abnormal findings: Secondary | ICD-10-CM

## 2020-10-26 DIAGNOSIS — E039 Hypothyroidism, unspecified: Secondary | ICD-10-CM | POA: Diagnosis not present

## 2020-10-26 DIAGNOSIS — R0989 Other specified symptoms and signs involving the circulatory and respiratory systems: Secondary | ICD-10-CM

## 2020-10-26 DIAGNOSIS — Z23 Encounter for immunization: Secondary | ICD-10-CM

## 2020-10-26 DIAGNOSIS — E7800 Pure hypercholesterolemia, unspecified: Secondary | ICD-10-CM | POA: Diagnosis not present

## 2020-10-26 LAB — CBC WITH DIFFERENTIAL/PLATELET
Basophils Absolute: 0.1 10*3/uL (ref 0.0–0.1)
Basophils Relative: 0.9 % (ref 0.0–3.0)
Eosinophils Absolute: 0.3 10*3/uL (ref 0.0–0.7)
Eosinophils Relative: 3.9 % (ref 0.0–5.0)
HCT: 46.9 % (ref 39.0–52.0)
Hemoglobin: 15.8 g/dL (ref 13.0–17.0)
Lymphocytes Relative: 27.7 % (ref 12.0–46.0)
Lymphs Abs: 2 10*3/uL (ref 0.7–4.0)
MCHC: 33.7 g/dL (ref 30.0–36.0)
MCV: 90.6 fl (ref 78.0–100.0)
Monocytes Absolute: 0.6 10*3/uL (ref 0.1–1.0)
Monocytes Relative: 8.6 % (ref 3.0–12.0)
Neutro Abs: 4.2 10*3/uL (ref 1.4–7.7)
Neutrophils Relative %: 58.9 % (ref 43.0–77.0)
Platelets: 183 10*3/uL (ref 150.0–400.0)
RBC: 5.17 Mil/uL (ref 4.22–5.81)
RDW: 14 % (ref 11.5–15.5)
WBC: 7.1 10*3/uL (ref 4.0–10.5)

## 2020-10-26 LAB — COMPREHENSIVE METABOLIC PANEL
ALT: 10 U/L (ref 0–53)
AST: 14 U/L (ref 0–37)
Albumin: 4.3 g/dL (ref 3.5–5.2)
Alkaline Phosphatase: 93 U/L (ref 39–117)
BUN: 9 mg/dL (ref 6–23)
CO2: 32 mEq/L (ref 19–32)
Calcium: 9.3 mg/dL (ref 8.4–10.5)
Chloride: 102 mEq/L (ref 96–112)
Creatinine, Ser: 0.86 mg/dL (ref 0.40–1.50)
GFR: 89.12 mL/min (ref >60.00)
Glucose, Bld: 92 mg/dL (ref 70–99)
Potassium: 4.8 mEq/L (ref 3.5–5.1)
Sodium: 140 mEq/L (ref 135–145)
Total Bilirubin: 0.5 mg/dL (ref 0.2–1.2)
Total Protein: 7.3 g/dL (ref 6.0–8.3)

## 2020-10-26 LAB — TSH: TSH: 0.76 u[IU]/mL (ref 0.35–4.50)

## 2020-10-26 LAB — LIPID PANEL
Cholesterol: 147 mg/dL (ref 0–200)
HDL: 54 mg/dL (ref >39.00)
LDL Cholesterol: 81 mg/dL (ref 0–99)
NonHDL: 93.31
Total CHOL/HDL Ratio: 3
Triglycerides: 63 mg/dL (ref 0.0–149.0)
VLDL: 12.6 mg/dL (ref 0.0–40.0)

## 2020-10-26 MED ORDER — TETANUS-DIPHTH-ACELL PERTUSSIS 5-2.5-18.5 LF-MCG/0.5 IM SUSP: 0.5000 mL | Freq: Once | INTRAMUSCULAR | 0 refills | Status: AC

## 2020-10-26 MED ORDER — ZOSTER VAC RECOMB ADJUVANTED 50 MCG/0.5ML IM SUSR: 0.5000 mL | Freq: Once | INTRAMUSCULAR | 0 refills | Status: AC

## 2020-10-26 MED ORDER — LEVOTHYROXINE SODIUM 150 MCG PO TABS
ORAL_TABLET | ORAL | 3 refills | Status: DC
Start: 2020-10-26 — End: 2021-11-26

## 2020-10-26 MED ORDER — ROSUVASTATIN CALCIUM 10 MG PO TABS: 10.0000 mg | ORAL_TABLET | Freq: Every day | ORAL | 3 refills | Status: DC

## 2020-10-26 NOTE — Addendum Note (Signed)
Addended by: Calton Golds A on: 10/26/2020 10:58 AM   Modules accepted: Orders
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
## 2020-11-03 ENCOUNTER — Ambulatory Visit (HOSPITAL_COMMUNITY): Payer: BC Managed Care – PPO

## 2020-11-20 ENCOUNTER — Encounter: Payer: Self-pay | Admitting: Family Medicine

## 2020-11-20 ENCOUNTER — Telehealth: Payer: Self-pay | Admitting: Family Medicine

## 2020-11-20 NOTE — Telephone Encounter (Signed)
Pt did not have US Aorta completed as he was advised of cost of $900.   Pt states that he cancelled the appt 11/03/2020. Pt wanted Dr. Milinda Cave to be aware.

## 2020-11-20 NOTE — Telephone Encounter (Signed)
FYI  Please see below

## 2020-11-20 NOTE — Telephone Encounter (Signed)
Noted  

## 2021-10-30 ENCOUNTER — Other Ambulatory Visit: Payer: Self-pay

## 2021-10-30 MED ORDER — ROSUVASTATIN CALCIUM 10 MG PO TABS: 10.0000 mg | ORAL_TABLET | Freq: Every day | ORAL | 0 refills | Status: DC

## 2021-11-26 ENCOUNTER — Other Ambulatory Visit: Payer: Self-pay

## 2021-11-26 MED ORDER — LEVOTHYROXINE SODIUM 150 MCG PO TABS: ORAL_TABLET | ORAL | 0 refills | Status: DC

## 2021-11-26 MED ORDER — ROSUVASTATIN CALCIUM 10 MG PO TABS: 10.0000 mg | ORAL_TABLET | Freq: Every day | ORAL | 0 refills | Status: DC

## 2022-01-04 ENCOUNTER — Telehealth: Payer: Self-pay

## 2022-01-04 DIAGNOSIS — E039 Hypothyroidism, unspecified: Secondary | ICD-10-CM

## 2022-01-04 DIAGNOSIS — E7800 Pure hypercholesterolemia, unspecified: Secondary | ICD-10-CM

## 2022-01-04 DIAGNOSIS — Z125 Encounter for screening for malignant neoplasm of prostate: Secondary | ICD-10-CM

## 2022-01-04 MED ORDER — ROSUVASTATIN CALCIUM 10 MG PO TABS: 10.0000 mg | ORAL_TABLET | Freq: Every day | ORAL | 0 refills | Status: DC

## 2022-01-04 MED ORDER — LEVOTHYROXINE SODIUM 150 MCG PO TABS: ORAL_TABLET | ORAL | 0 refills | Status: DC

## 2022-01-04 NOTE — Telephone Encounter (Signed)
Yes okay for labs on Tuesday. Orders are in

## 2022-01-04 NOTE — Telephone Encounter (Signed)
Patient refill request on meds, he doesn't have many pills left, he forgot to make appt last time.  He said that he has been with the same company for 23 years and has never missed a day of work. Only day he is off is Tuesday pm and on Thursdays. He would like to do blood work on Tuesday afternoon, that way he will have results at his appt on Thurs 7/27  Place order for labs?  Refill meds 30 d/s or until 7/27 appt  [facility] [address]  levothyroxine (SYNTHROID) 150 MCG tablet [[id number]]   rosuvastatin (CRESTOR) 10 MG tablet [[id number]]   Please call and leave message

## 2022-01-04 NOTE — Telephone Encounter (Signed)
30 day supply sent on both medications. Please advise if ok for labs to be done on Tuesday 7/25 prior to [facility] f/u on Thursday 7/27.   Labs will have to be entered.

## 2022-01-07 NOTE — Telephone Encounter (Signed)
LM for pt to return call to discuss.  

## 2022-01-10 ENCOUNTER — Encounter: Payer: Self-pay | Admitting: Family Medicine

## 2022-01-10 ENCOUNTER — Ambulatory Visit: Payer: BC Managed Care – PPO | Admitting: Family Medicine

## 2022-01-10 VITALS — BP 129/71 | HR 76 | Temp 97.7°F | Ht 71.0 in | Wt 161.0 lb

## 2022-01-10 DIAGNOSIS — E039 Hypothyroidism, unspecified: Secondary | ICD-10-CM

## 2022-01-10 DIAGNOSIS — E7800 Pure hypercholesterolemia, unspecified: Secondary | ICD-10-CM

## 2022-01-10 DIAGNOSIS — Z1211 Encounter for screening for malignant neoplasm of colon: Secondary | ICD-10-CM | POA: Diagnosis not present

## 2022-01-10 DIAGNOSIS — Z125 Encounter for screening for malignant neoplasm of prostate: Secondary | ICD-10-CM | POA: Diagnosis not present

## 2022-01-10 DIAGNOSIS — Z0000 Encounter for general adult medical examination without abnormal findings: Secondary | ICD-10-CM

## 2022-01-10 DIAGNOSIS — R0989 Other specified symptoms and signs involving the circulatory and respiratory systems: Secondary | ICD-10-CM

## 2022-01-10 DIAGNOSIS — Z23 Encounter for immunization: Secondary | ICD-10-CM

## 2022-01-10 LAB — COMPREHENSIVE METABOLIC PANEL
ALT: 14 U/L (ref 0–53)
AST: 15 U/L (ref 0–37)
Albumin: 4.4 g/dL (ref 3.5–5.2)
Alkaline Phosphatase: 96 U/L (ref 39–117)
BUN: 13 mg/dL (ref 6–23)
CO2: 29 mEq/L (ref 19–32)
Calcium: 9.1 mg/dL (ref 8.4–10.5)
Chloride: 103 mEq/L (ref 96–112)
Creatinine, Ser: 0.85 mg/dL (ref 0.40–1.50)
GFR: 88.68 mL/min (ref >60.00)
Glucose, Bld: 95 mg/dL (ref 70–99)
Potassium: 5 mEq/L (ref 3.5–5.1)
Sodium: 140 mEq/L (ref 135–145)
Total Bilirubin: 0.5 mg/dL (ref 0.2–1.2)
Total Protein: 7.3 g/dL (ref 6.0–8.3)

## 2022-01-10 LAB — CBC
HCT: 46.1 % (ref 39.0–52.0)
Hemoglobin: 15.7 g/dL (ref 13.0–17.0)
MCHC: 34.1 g/dL (ref 30.0–36.0)
MCV: 90.9 fl (ref 78.0–100.0)
Platelets: 175 10*3/uL (ref 150.0–400.0)
RBC: 5.08 Mil/uL (ref 4.22–5.81)
RDW: 14 % (ref 11.5–15.5)
WBC: 7.4 10*3/uL (ref 4.0–10.5)

## 2022-01-10 LAB — LIPID PANEL
Cholesterol: 156 mg/dL (ref 0–200)
HDL: 51.4 mg/dL (ref >39.00)
LDL Cholesterol: 93 mg/dL (ref 0–99)
NonHDL: 104.99
Total CHOL/HDL Ratio: 3
Triglycerides: 62 mg/dL (ref 0.0–149.0)
VLDL: 12.4 mg/dL (ref 0.0–40.0)

## 2022-01-10 LAB — TSH: TSH: 1.02 u[IU]/mL (ref 0.35–5.50)

## 2022-01-10 LAB — PSA, MEDICARE: PSA: 0.71 ng/ml (ref 0.10–4.00)

## 2022-01-10 MED ORDER — ROSUVASTATIN CALCIUM 10 MG PO TABS
10.0000 mg | ORAL_TABLET | Freq: Every day | ORAL | 1 refills | Status: DC
Start: 2022-01-10 — End: 2022-07-30

## 2022-01-10 MED ORDER — LEVOTHYROXINE SODIUM 150 MCG PO TABS: ORAL_TABLET | ORAL | 1 refills | Status: DC

## 2022-01-10 MED ORDER — TETANUS-DIPHTH-ACELL PERTUSSIS 5-2-15.5 LF-MCG/0.5 IM SUSP: 0.5000 mL | Freq: Once | INTRAMUSCULAR | 0 refills | Status: AC

## 2022-01-10 MED ORDER — ZOSTER VAC RECOMB ADJUVANTED 50 MCG/0.5ML IM SUSR: 0.5000 mL | Freq: Once | INTRAMUSCULAR | 0 refills | Status: AC
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
## 2022-01-10 NOTE — Patient Instructions (Signed)
Health Maintenance, Male Adopting a healthy lifestyle and getting preventive care are important in promoting health and wellness. Ask your health care provider about: The right schedule for you to have regular tests and exams. Things you can do on your own to prevent diseases and keep yourself healthy. What should I know about diet, weight, and exercise? Eat a healthy diet  Eat a diet that includes plenty of vegetables, fruits, low-fat dairy products, and lean protein. Do not eat a lot of foods that are high in solid fats, added sugars, or sodium. Maintain a healthy weight Body mass index (BMI) is a measurement that can be used to identify possible weight problems. It estimates body fat based on height and weight. Your health care provider can help determine your BMI and help you achieve or maintain a healthy weight. Get regular exercise Get regular exercise. This is one of the most important things you can do for your health. Most adults should: Exercise for at least 150 minutes each week. The exercise should increase your heart rate and make you sweat (moderate-intensity exercise). Do strengthening exercises at least twice a week. This is in addition to the moderate-intensity exercise. Spend less time sitting. Even light physical activity can be beneficial. Watch cholesterol and blood lipids Have your blood tested for lipids and cholesterol at 69 years of age, then have this test every 5 years. You may need to have your cholesterol levels checked more often if: Your lipid or cholesterol levels are high. You are older than 69 years of age. You are at high risk for heart disease. What should I know about cancer screening? Many types of cancers can be detected early and may often be prevented. Depending on your health history and family history, you may need to have cancer screening at various ages. This may include screening for: Colorectal cancer. Prostate cancer. Skin cancer. Lung  cancer. What should I know about heart disease, diabetes, and high blood pressure? Blood pressure and heart disease High blood pressure causes heart disease and increases the risk of stroke. This is more likely to develop in people who have high blood pressure readings or are overweight. Talk with your health care provider about your target blood pressure readings. Have your blood pressure checked: Every 3-5 years if you are 18-39 years of age. Every year if you are 40 years old or older. If you are between the ages of 65 and 75 and are a current or former smoker, ask your health care provider if you should have a one-time screening for abdominal aortic aneurysm (AAA). Diabetes Have regular diabetes screenings. This checks your fasting blood sugar level. Have the screening done: Once every three years after age 45 if you are at a normal weight and have a low risk for diabetes. More often and at a younger age if you are overweight or have a high risk for diabetes. What should I know about preventing infection? Hepatitis B If you have a higher risk for hepatitis B, you should be screened for this virus. Talk with your health care provider to find out if you are at risk for hepatitis B infection. Hepatitis C Blood testing is recommended for: Everyone born from 1945 through 1965. Anyone with known risk factors for hepatitis C. Sexually transmitted infections (STIs) You should be screened each year for STIs, including gonorrhea and chlamydia, if: You are sexually active and are younger than 69 years of age. You are older than 69 years of age and your   health care provider tells you that you are at risk for this type of infection. Your sexual activity has changed since you were last screened, and you are at increased risk for chlamydia or gonorrhea. Ask your health care provider if you are at risk. Ask your health care provider about whether you are at high risk for HIV. Your health care provider  may recommend a prescription medicine to help prevent HIV infection. If you choose to take medicine to prevent HIV, you should first get tested for HIV. You should then be tested every 3 months for as long as you are taking the medicine. Follow these instructions at home: Alcohol use Do not drink alcohol if your health care provider tells you not to drink. If you drink alcohol: Limit how much you have to 0-2 drinks a day. Know how much alcohol is in your drink. In the [place] one drink equals one 12 oz bottle of beer (355 mL), one 5 oz glass of wine (148 mL), or one 1 oz glass of hard liquor (44 mL). Lifestyle Do not use any products that contain nicotine or tobacco. These products include cigarettes, chewing tobacco, and vaping devices, such as e-cigarettes. If you need help quitting, ask your health care provider. Do not use street drugs. Do not share needles. Ask your health care provider for help if you need support or information about quitting drugs. General instructions Schedule regular health, dental, and eye exams. Stay current with your vaccines. Tell your health care provider if: You often feel depressed. You have ever been abused or do not feel safe at home. Summary Adopting a healthy lifestyle and getting preventive care are important in promoting health and wellness. Follow your health care provider's instructions about healthy diet, exercising, and getting tested or screened for diseases. Follow your health care provider's instructions on monitoring your cholesterol and blood pressure. This information is not intended to replace advice given to you by your health care provider. Make sure you discuss any questions you have with your health care provider. Document Revised: 10/23/2020 Document Reviewed: 10/23/2020 Elsevier Patient Education  2023 [employer]  

## 2022-07-30 ENCOUNTER — Telehealth (INDEPENDENT_AMBULATORY_CARE_PROVIDER_SITE_OTHER): Payer: BC Managed Care – PPO | Admitting: Family Medicine

## 2022-07-30 ENCOUNTER — Encounter: Payer: Self-pay | Admitting: Family Medicine

## 2022-07-30 DIAGNOSIS — J069 Acute upper respiratory infection, unspecified: Secondary | ICD-10-CM | POA: Diagnosis not present

## 2022-07-30 MED ORDER — LEVOTHYROXINE SODIUM 150 MCG PO TABS: ORAL_TABLET | ORAL | 0 refills | Status: DC

## 2022-07-30 MED ORDER — ROSUVASTATIN CALCIUM 10 MG PO TABS
10.0000 mg | ORAL_TABLET | Freq: Every day | ORAL | 0 refills | Status: DC
Start: 2022-07-30 — End: 2022-11-18

## 2022-07-30 NOTE — Progress Notes (Signed)
Virtual Visit via Video Note  I connected with Joseph Meyers  on 07/30/22 at 10:00 AM EST by a video enabled telemedicine application and verified that I am speaking with the correct person using two identifiers.  Location patient: [place] Location provider:work or home office Persons participating in the virtual visit: patient, provider  I discussed the limitations and requested verbal permission for telemedicine visit. The patient expressed understanding and agreed to proceed.   HPI: 72 year old male being seen today for "cold/cough". Onset 4 days ago of runny nose, postnasal drip, felt like he was wheezing. No shortness of breath, no fever, no purulent sputum production.  Actually describes very minimal coughing. Fortunately, he says he has significantly improved in the last 24 to 36 hours.  He is taking Robitussin DM and feels like this is helping significantly.   History of COPD documented in EMR but he states it has been years since he has had any respiratory problems.  ROS: See pertinent positives and negatives per HPI.  Past Medical History:  Diagnosis Date   Colon cancer screening    Pt has never had colonoscopy due to fear of prep; 2014 consult with Dr. Garfield Cornea recommended he get colonoscopy and he agreed but never scheduled it.   COPD (chronic obstructive pulmonary disease) (HCC)    History of genital warts    Inguinal hernia, without mention of obstruction or gangrene    Prominent abdominal aortic pulsation    2022->aortic u/s ordered but pt could not afford.   Pure hypercholesterolemia    he has been hesitant to start statin even though he has purchased it from pharmacy   Seborrheic dermatitis, unspecified    Tobacco use disorder    Unspecified hearing loss    Unspecified hypothyroidism    Varicose veins of leg with pain, left    intermittently painful, wears compression hose.  The varicosities stemmed from vein injury near his knee during a rafting trip in the REMOTE past     Past Surgical History:  Procedure Laterality Date   EXTERNAL EAR SURGERY     mastoid had nail in it, hit by nail gun   TONSILLECTOMY       Current Outpatient Medications:    clotrimazole-betamethasone (LOTRISONE) cream, Apply topically 2 (two) times daily., Disp: 45 g, Rfl: 1   desonide (DESOWEN) 0.05 % lotion, Apply 1 application topically daily as needed., Disp: 59 mL, Rfl: 1   levothyroxine (SYNTHROID) 150 MCG tablet, TAKE 1 TABLET BY MOUTH EVERY DAY., Disp: 90 tablet, Rfl: 1   rosuvastatin (CRESTOR) 10 MG tablet, Take 1 tablet (10 mg total) by mouth daily., Disp: 90 tablet, Rfl: 1  EXAM:  VITALS per patient if applicable:     [id number]   11:10 AM 10/26/2020   10:13 AM 04/27/2020   10:54 AM  Vitals with BMI  Height 5' 11"$  5' 11"$  6' 0"$   Weight 161 lbs 163 lbs 13 oz 160 lbs  BMI 22.46 [id number] 0000000  Systolic Q000111Q [id number] 0000000  Diastolic 71 78 83  Pulse 76 79 77     GENERAL: alert, oriented, appears well and in no acute distress  HEENT: atraumatic, conjunttiva clear, no obvious abnormalities on inspection of external nose and ears  NECK: normal movements of the head and neck  LUNGS: on inspection no signs of respiratory distress, breathing rate appears normal, no obvious gross SOB, gasping or wheezing  CV: no obvious cyanosis  MS: moves all visible extremities without noticeable abnormality  PSYCH/NEURO: pleasant and cooperative, no  obvious depression or anxiety, speech and thought processing grossly intact  LABS: none today    Chemistry      Component Value Date/Time   NA 140 01/10/2022 1146   K 5.0 01/10/2022 1146   CL 103 01/10/2022 1146   CO2 29 01/10/2022 1146   BUN 13 01/10/2022 1146   CREATININE 0.85 01/10/2022 1146      Component Value Date/Time   CALCIUM 9.1 01/10/2022 1146   ALKPHOS 96 01/10/2022 1146   AST 15 01/10/2022 1146   ALT 14 01/10/2022 1146   BILITOT 0.5 01/10/2022 1146     ASSESSMENT AND PLAN:  Discussed the following assessment and  plan:  URI with postnasal drip cough. Improving on Robitussin DM. Continue this, expect gradual resolution over the next 7 to 10 days. Signs/symptoms to call or return for were reviewed and pt expressed understanding.   I discussed the assessment and treatment plan with the patient. The patient was provided an opportunity to ask questions and all were answered. The patient agreed with the plan and demonstrated an understanding of the instructions.   F/u: He will make appointment for routine chronic illness follow-up at his earliest convenience. I did send in refills for 90 days worth of his rosuvastatin and levothyroxine today.  Signed:  Crissie Sickles, MD           07/30/2022

## 2022-10-29 NOTE — Progress Notes (Signed)
[facility] Quality Team Note  Name: Joseph Meyers Date of Birth: Jan 10, 1951 MRN: [id number] Date: 10/29/2022  [facility] Quality Team has reviewed this patient's chart, please see recommendations below:  [facility] Quality Other; ([place]- Spoke with patient to offer FOBT Kit pick up from [facility], Patient states he has this kit at home just needs to complete.)

## 2022-11-13 ENCOUNTER — Other Ambulatory Visit: Payer: Self-pay

## 2022-11-18 MED ORDER — ROSUVASTATIN CALCIUM 10 MG PO TABS: 10.0000 mg | ORAL_TABLET | Freq: Every day | ORAL | 0 refills | Status: DC

## 2022-11-18 MED ORDER — LEVOTHYROXINE SODIUM 150 MCG PO TABS: ORAL_TABLET | ORAL | 0 refills | Status: DC

## 2022-11-18 NOTE — Telephone Encounter (Signed)
30 day supply given, he is due for repeat labs. He can receive more once appt and labs completed.

## 2022-11-18 NOTE — Addendum Note (Signed)
Addended by: Emi Holes D on: 11/18/2022 04:29 PM   Modules accepted: Orders

## 2022-11-18 NOTE — Telephone Encounter (Signed)
Patient refill request.  He states pharmacy has sent over twice.  I told him it was time for an office visit.  He is having family come in from out of state for his Mom's "celebration of life" over the next couple of weeks.  Patient scheduled appt for 6/27 at 10:40AM with Dr. Milinda Cave.  [facility] - [place], [place]  Patient requesting 68 d/s supply so he will have discount on insurance.  levothyroxine (SYNTHROID) 150 MCG tablet   rosuvastatin (CRESTOR) 10 MG tablet

## 2022-11-27 ENCOUNTER — Other Ambulatory Visit: Payer: Self-pay

## 2022-11-27 NOTE — Telephone Encounter (Signed)
Patient driving home and decided to stop by office.  He is requesting refill on expired prescription.  He has an upcoming appt scheduled on 6/27 with Dr. Milinda Cave.  He is not available until then because he is off from work only on Thursdays. (No appts available 6/13 or 6/20).  [facility] - [address], [place]  clotrimazole-betamethasone (LOTRISONE) cream

## 2022-11-28 MED ORDER — CLOTRIMAZOLE-BETAMETHASONE 1-0.05 % EX CREA: TOPICAL_CREAM | Freq: Two times a day (BID) | CUTANEOUS | 1 refills | Status: DC

## 2022-11-28 NOTE — Telephone Encounter (Signed)
Rx filled

## 2022-11-28 NOTE — Telephone Encounter (Signed)
Medication last refilled November 2021  Please fill, if appropriate.

## 2022-11-28 NOTE — Telephone Encounter (Signed)
MyChart message read.

## 2022-11-28 NOTE — Telephone Encounter (Signed)
Called pt but did not answer. VM box was full. Leaving a MyChart message.

## 2022-12-11 ENCOUNTER — Other Ambulatory Visit: Payer: Self-pay | Admitting: Family Medicine

## 2022-12-11 NOTE — Patient Instructions (Signed)

## 2022-12-12 ENCOUNTER — Encounter: Payer: Self-pay | Admitting: Family Medicine

## 2022-12-12 ENCOUNTER — Ambulatory Visit: Payer: BC Managed Care – PPO | Admitting: Family Medicine

## 2022-12-12 VITALS — BP 134/68 | HR 63 | Wt 159.6 lb

## 2022-12-12 DIAGNOSIS — E039 Hypothyroidism, unspecified: Secondary | ICD-10-CM | POA: Diagnosis not present

## 2022-12-12 DIAGNOSIS — E7800 Pure hypercholesterolemia, unspecified: Secondary | ICD-10-CM

## 2022-12-12 DIAGNOSIS — Z125 Encounter for screening for malignant neoplasm of prostate: Secondary | ICD-10-CM

## 2022-12-12 LAB — COMPREHENSIVE METABOLIC PANEL
ALT: 9 U/L (ref 0–53)
AST: 13 U/L (ref 0–37)
Albumin: 4.3 g/dL (ref 3.5–5.2)
Alkaline Phosphatase: 98 U/L (ref 39–117)
BUN: 21 mg/dL (ref 6–23)
CO2: 29 mEq/L (ref 19–32)
Calcium: 9.4 mg/dL (ref 8.4–10.5)
Chloride: 105 mEq/L (ref 96–112)
Creatinine, Ser: 0.87 mg/dL (ref 0.40–1.50)
GFR: 87.49 mL/min (ref >60.00)
Glucose, Bld: 86 mg/dL (ref 70–99)
Potassium: 4.9 mEq/L (ref 3.5–5.1)
Sodium: 143 mEq/L (ref 135–145)
Total Bilirubin: 0.6 mg/dL (ref 0.2–1.2)
Total Protein: 7.2 g/dL (ref 6.0–8.3)

## 2022-12-12 LAB — TSH: TSH: 0.76 u[IU]/mL (ref 0.35–5.50)

## 2022-12-12 LAB — LIPID PANEL
Cholesterol: 136 mg/dL (ref 0–200)
HDL: 47.6 mg/dL (ref >39.00)
LDL Cholesterol: 76 mg/dL (ref 0–99)
NonHDL: 88.43
Total CHOL/HDL Ratio: 3
Triglycerides: 61 mg/dL (ref 0.0–149.0)
VLDL: 12.2 mg/dL (ref 0.0–40.0)

## 2022-12-12 LAB — PSA, MEDICARE: PSA: 0.99 ng/ml (ref 0.10–4.00)

## 2022-12-12 MED ORDER — DESONIDE 0.05 % EX LOTN: 1.0000 | TOPICAL_LOTION | Freq: Every day | CUTANEOUS | 1 refills | Status: DC | PRN

## 2022-12-12 MED ORDER — ROSUVASTATIN CALCIUM 10 MG PO TABS: 10.0000 mg | ORAL_TABLET | Freq: Every day | ORAL | 1 refills | Status: DC

## 2022-12-12 MED ORDER — LEVOTHYROXINE SODIUM 150 MCG PO TABS: ORAL_TABLET | ORAL | 1 refills | Status: DC

## 2022-12-12 NOTE — Progress Notes (Signed)
OFFICE VISIT  12/12/2022  CC:  Chief Complaint  Patient presents with   Medical Management of Chronic Issues    Med refills    Patient is a 72 y.o. male who presents for follow-up hypothyroidism and hypercholesterolemia. I last saw him on 01/10/2022 for his annual physical and he was doing well on levothyroxine 150 mcg a day and Crestor 10 mg a day. All of his labs were normal/at goal at that time.  INTERIM HX: Joseph Meyers is feeling fine.  ROS :   no fevers, no CP, no SOB, no wheezing, no cough, no dizziness, no HAs, no rashes, no melena/hematochezia.  No polyuria or polydipsia.  No myalgias or arthralgias.  No focal weakness, paresthesias, or tremors.  No acute vision or hearing abnormalities.  No dysuria or unusual/new urinary urgency or frequency.  No recent changes in lower legs. No n/v/d or abd pain.  No palpitations.      Past Medical History:  Diagnosis Date   Colon cancer screening    Pt has never had colonoscopy due to fear of prep; 2014 consult with Dr. Remigio Eisenmenger recommended he get colonoscopy and he agreed but never scheduled it.   COPD (chronic obstructive pulmonary disease) (HCC)    History of genital warts    Inguinal hernia, without mention of obstruction or gangrene    Prominent abdominal aortic pulsation    2022->aortic u/s ordered but pt could not afford.   Pure hypercholesterolemia    he has been hesitant to start statin even though he has purchased it from pharmacy   Seborrheic dermatitis, unspecified    Tobacco use disorder    Unspecified hearing loss    Unspecified hypothyroidism    Varicose veins of leg with pain, left    intermittently painful, wears compression hose.  The varicosities stemmed from vein injury near his knee during a rafting trip in the REMOTE past    Past Surgical History:  Procedure Laterality Date   EXTERNAL EAR SURGERY     mastoid had nail in it, hit by nail gun   TONSILLECTOMY      Outpatient Medications Prior to Visit   Medication Sig Dispense Refill   clotrimazole-betamethasone (LOTRISONE) cream Apply topically 2 (two) times daily. 45 g 1   desonide (DESOWEN) 0.05 % lotion Apply 1 application topically daily as needed. 59 mL 1   levothyroxine (SYNTHROID) 150 MCG tablet TAKE 1 TABLET BY MOUTH EVERY DAY. 30 tablet 0   rosuvastatin (CRESTOR) 10 MG tablet Take 1 tablet (10 mg total) by mouth daily. 30 tablet 0   No facility-administered medications prior to visit.    Allergies  Allergen Reactions   Clindamycin/Lincomycin Itching   Penicillins     REACTION: rash   Amoxicillin Rash    Review of Systems As per HPI  PE:    12/12/2022   10:49 AM 01/10/2022   11:10 AM 10/26/2020   10:13 AM  Vitals with BMI  Height  5\' 11"  5\' 11"   Weight 159 lbs 10 oz 161 lbs 163 lbs 13 oz  BMI  22.46 22.86  Systolic 134 129 706  Diastolic 68 71 78  Pulse 63 76 79     Physical Exam  Gen: Alert, well appearing.  Patient is oriented to person, place, time, and situation. AFFECT: pleasant, lucid thought and speech. CBJ:SEGB: no injection, icteris, swelling, or exudate.  EOMI, PERRLA. Mouth: lips without lesion/swelling.  Oral mucosa pink and moist. Oropharynx without erythema, exudate, or swelling.  Neck: supple/nontender.  No LAD, mass, or TM.  Carotid pulses 2+ bilaterally, without bruits. CV: RRR, no m/r/g.   LUNGS: CTA bilat, nonlabored resps, good aeration in all lung fields. ABD: soft, NT, ND EXT: no clubbing, cyanosis, or edema.    LABS:  Last CBC Lab Results  Component Value Date   WBC 7.4 01/10/2022   HGB 15.7 01/10/2022   HCT 46.1 01/10/2022   MCV 90.9 01/10/2022   RDW 14.0 01/10/2022   PLT 175.0 01/10/2022   Last metabolic panel Lab Results  Component Value Date   GLUCOSE 95 01/10/2022   NA 140 01/10/2022   K 5.0 01/10/2022   CL 103 01/10/2022   CO2 29 01/10/2022   BUN 13 01/10/2022   CREATININE 0.85 01/10/2022   CALCIUM 9.1 01/10/2022   PROT 7.3 01/10/2022   ALBUMIN 4.4  01/10/2022   BILITOT 0.5 01/10/2022   ALKPHOS 96 01/10/2022   AST 15 01/10/2022   ALT 14 01/10/2022   Last lipids Lab Results  Component Value Date   CHOL 156 01/10/2022   HDL 51.40 01/10/2022   LDLCALC 93 01/10/2022   LDLDIRECT 150.7 08/11/2012   TRIG 62.0 01/10/2022   CHOLHDL 3 01/10/2022   Last thyroid functions Lab Results  Component Value Date   TSH 1.02 01/10/2022   Lab Results  Component Value Date   PSA 0.71 01/10/2022   PSA 1.28 04/20/2020   PSA 0.91 07/10/2017   IMPRESSION AND PLAN:  #1 hypercholesterolemia, doing well on Crestor 10 mg a day. Lipid panel and hepatic panel today.  2.  Acquired hypothyroidism, doing well on 150 mcg levothyroxine daily.  #3  Prostate cancer screening: PSA today.  If normal then no further PSA screening is indicated.  An After Visit Summary was printed and given to the patient.  FOLLOW UP: Return in about 6 months (around 06/13/2023) for routine chronic illness f/u.  Signed:  Santiago Bumpers, MD           12/12/2022

## 2022-12-19 ENCOUNTER — Other Ambulatory Visit (HOSPITAL_COMMUNITY): Payer: Self-pay

## 2022-12-19 ENCOUNTER — Telehealth: Payer: Self-pay

## 2022-12-19 NOTE — Telephone Encounter (Signed)
Patient Advocate Encounter  Prior authorization for Desonide 0.05% lotion submitted and APPROVED through [employer].  Test billing returns $8.00 copay for 30 day supply.  Key [id number] Effective: 12-19-2022 - 12-18-2023

## 2023-05-01 ENCOUNTER — Telehealth: Payer: Self-pay

## 2023-05-01 NOTE — Telephone Encounter (Signed)
Patient has used all cream up,and requesting refill.  Prescription Request  05/01/2023  LOV: Visit date not found  What is the name of the medication or equipment? clotrimazole-betamethasone (LOTRISONE) cream   Have you contacted your pharmacy to request a refill? No   Which pharmacy would you like this sent to?  [facility] [phone] - [place], [place] - [address] AT [facility] OF [address] [place] [address] Phone: [phone] Fax: [phone]    Patient notified that their request is being sent to the clinical staff for review and that they should receive a response within 2 business days.   Please advise at Mobile [phone] (mobile)

## 2023-05-02 MED ORDER — CLOTRIMAZOLE-BETAMETHASONE 1-0.05 % EX CREA: TOPICAL_CREAM | Freq: Two times a day (BID) | CUTANEOUS | 1 refills | Status: DC

## 2023-05-02 NOTE — Telephone Encounter (Signed)
Okay, prescription sent 

## 2023-06-17 ENCOUNTER — Other Ambulatory Visit: Payer: Self-pay | Admitting: Family Medicine

## 2023-06-26 ENCOUNTER — Ambulatory Visit: Payer: BC Managed Care – PPO | Admitting: Family Medicine

## 2023-06-26 ENCOUNTER — Encounter: Payer: Self-pay | Admitting: Family Medicine

## 2023-06-26 VITALS — BP 135/73 | HR 70 | Wt 167.4 lb

## 2023-06-26 DIAGNOSIS — E039 Hypothyroidism, unspecified: Secondary | ICD-10-CM | POA: Diagnosis not present

## 2023-06-26 DIAGNOSIS — E7800 Pure hypercholesterolemia, unspecified: Secondary | ICD-10-CM

## 2023-06-26 DIAGNOSIS — Z0000 Encounter for general adult medical examination without abnormal findings: Secondary | ICD-10-CM | POA: Diagnosis not present

## 2023-06-26 LAB — LIPID PANEL
Cholesterol: 153 mg/dL (ref 0–200)
HDL: 50.5 mg/dL (ref >39.00)
LDL Cholesterol: 91 mg/dL (ref 0–99)
NonHDL: 102.86
Total CHOL/HDL Ratio: 3
Triglycerides: 60 mg/dL (ref 0.0–149.0)
VLDL: 12 mg/dL (ref 0.0–40.0)

## 2023-06-26 LAB — TSH: TSH: 1.68 u[IU]/mL (ref 0.35–5.50)

## 2023-06-26 MED ORDER — CLOTRIMAZOLE-BETAMETHASONE 1-0.05 % EX CREA: TOPICAL_CREAM | Freq: Two times a day (BID) | CUTANEOUS | 1 refills | Status: DC

## 2023-06-26 MED ORDER — LEVOTHYROXINE SODIUM 150 MCG PO TABS: ORAL_TABLET | ORAL | 3 refills | Status: DC

## 2023-06-26 MED ORDER — DESONIDE 0.05 % EX LOTN: 1.0000 | TOPICAL_LOTION | Freq: Every day | CUTANEOUS | 1 refills | Status: DC | PRN

## 2023-06-26 MED ORDER — ROSUVASTATIN CALCIUM 10 MG PO TABS: 10.0000 mg | ORAL_TABLET | Freq: Every day | ORAL | 3 refills | Status: DC

## 2023-06-26 NOTE — Progress Notes (Signed)
 Office Note 06/26/2023  CC:  Chief Complaint  Patient presents with   Medical Management of Chronic Issues    Pt is fasting.    Patient is a 73 y.o. male who is here for annual health maintenance exam and 33-month follow-up hyperlipidemia and hypothyroidism. A/P as of last visit: #1 hypercholesterolemia, doing well on Crestor  10 mg a day. Lipid panel and hepatic panel today.   2.  Acquired hypothyroidism, doing well on 150 mcg levothyroxine  daily.   #3  Prostate cancer screening: PSA today.  If normal then no further PSA screening is indicated.  INTERIM HX: Vic is feeling well. He continues to work full-time and is very active on his job.   Past Medical History:  Diagnosis Date   Colon cancer screening    Pt has never had colonoscopy due to fear of prep; 2014 consult with Dr. Rennie recommended he get colonoscopy and he agreed but never scheduled it.   COPD (chronic obstructive pulmonary disease) (HCC)    History of genital warts    Inguinal hernia, without mention of obstruction or gangrene    Prominent abdominal aortic pulsation    2022->aortic u/s ordered but pt could not afford.   Pure hypercholesterolemia    he has been hesitant to start statin even though he has purchased it from pharmacy   Seborrheic dermatitis, unspecified    Tobacco use disorder    Unspecified hearing loss    Unspecified hypothyroidism    Varicose veins of leg with pain, left    intermittently painful, wears compression hose.  The varicosities stemmed from vein injury near his knee during a rafting trip in the REMOTE past    Past Surgical History:  Procedure Laterality Date   EXTERNAL EAR SURGERY     mastoid had nail in it, hit by nail gun   TONSILLECTOMY      Family History  Problem Relation Age of Onset   Thyroid  disease Father    Bipolar disorder Father    Leukemia Mother        CLL    Social History   Socioeconomic History   Marital status: Married    Spouse name:  karon   Number of children: 2   Years of education: Not on file   Highest education level: 12th grade  Occupational History   Occupation: Engineer, manufacturing systems    Comment: parts delivery  Tobacco Use   Smoking status: Every Day    Current packs/day: 0.00    Types: Cigarettes, Cigars    Last attempt to quit: 11/23/2012    Years since quitting: 10.5   Smokeless tobacco: Former    Types: Chew   Tobacco comments:    1 ppd  Vaping Use   Vaping status: Never Used  Substance and Sexual Activity   Alcohol use: No   Drug use: No   Sexual activity: Not on file  Other Topics Concern   Not on file  Social History Narrative   Married, 1 daughter and two sons.   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   Tob: cigars as of 2019.     Cigarettes: 50 pack-yr hx, quit 2013.   Alcohol: none   Social Drivers of Corporate Investment Banker Strain: Medium Risk (06/22/2023)   Overall Financial Resource Strain (CARDIA)    Difficulty of Paying Living Expenses: Somewhat hard  Food Insecurity: No Food Insecurity (06/22/2023)   Hunger Vital Sign    Worried About Running  Out of Food in the Last Year: Never true    Ran Out of Food in the Last Year: Never true  Transportation Needs: No Transportation Needs (06/22/2023)   PRAPARE - Administrator, Civil Service (Medical): No    Lack of Transportation (Non-Medical): No  Physical Activity: Sufficiently Active (06/22/2023)   Exercise Vital Sign    Days of Exercise per Week: 5 days    Minutes of Exercise per Session: 150+ min  Stress: No Stress Concern Present (06/22/2023)   [employer] of Occupational Health - Occupational Stress Questionnaire    Feeling of Stress : Not at all  Social Connections: Moderately Integrated (06/22/2023)   Social Connection and Isolation Panel [NHANES]    Frequency of Communication with Friends and Family: More than three times a week    Frequency of Social Gatherings with Friends and  Family: Three times a week    Attends Religious Services: More than 4 times per year    Active Member of Clubs or Organizations: No    Attends Engineer, Structural: Not on file    Marital Status: Married  Catering Manager Violence: Not on file    Outpatient Medications Prior to Visit  Medication Sig Dispense Refill   clotrimazole -betamethasone  (LOTRISONE ) cream Apply topically 2 (two) times daily. 45 g 1   desonide  (DESOWEN ) 0.05 % lotion Apply 1 Application topically daily as needed. 59 mL 1   levothyroxine  (SYNTHROID ) 150 MCG tablet TAKE 1 TABLET BY MOUTH EVERY DAY. 90 tablet 1   rosuvastatin  (CRESTOR ) 10 MG tablet Take 1 tablet (10 mg total) by mouth daily. 90 tablet 1   No facility-administered medications prior to visit.    Allergies  Allergen Reactions   Clindamycin/Lincomycin Itching   Penicillins     REACTION: rash   Amoxicillin Rash    Review of Systems  Constitutional:  Negative for appetite change, chills, fatigue and fever.  HENT:  Negative for congestion, dental problem, ear pain and sore throat.   Eyes:  Negative for discharge, redness and visual disturbance.  Respiratory:  Negative for cough, chest tightness, shortness of breath and wheezing.   Cardiovascular:  Negative for chest pain, palpitations and leg swelling.  Gastrointestinal:  Negative for abdominal pain, blood in stool, diarrhea, nausea and vomiting.  Genitourinary:  Negative for difficulty urinating, dysuria, flank pain, frequency, hematuria and urgency.  Musculoskeletal:  Negative for arthralgias, back pain, joint swelling, myalgias and neck stiffness.  Skin:  Negative for pallor and rash.  Neurological:  Negative for dizziness, speech difficulty, weakness and headaches.  Hematological:  Negative for adenopathy. Does not bruise/bleed easily.  Psychiatric/Behavioral:  Negative for confusion and sleep disturbance. The patient is not nervous/anxious.     PE;    06/26/2023   10:57 AM 12/12/2022    10:49 AM 01/10/2022   11:10 AM  Vitals with BMI  Height   5' 11  Weight 167 lbs 6 oz 159 lbs 10 oz 161 lbs  BMI   22.46  Systolic 135 134 870  Diastolic 73 68 71  Pulse 70 63 76   Gen: Alert, well appearing.  Patient is oriented to person, place, time, and situation. AFFECT: pleasant, lucid thought and speech. ENT: Ears: EACs clear, normal epithelium.  TMs with good light reflex and landmarks bilaterally.  Eyes: no injection, icteris, swelling, or exudate.  EOMI, PERRLA. Nose: no drainage or turbinate edema/swelling.  No injection or focal lesion.  Mouth: lips without lesion/swelling.  Oral mucosa pink and  moist.  Dentition intact and without obvious caries or gingival swelling.  Oropharynx without erythema, exudate, or swelling.  Neck: supple/nontender.  No LAD, mass, or TM.  Carotid pulses 2+ bilaterally, without bruits. CV: RRR, no m/r/g.   LUNGS: CTA bilat, nonlabored resps, good aeration in all lung fields. ABD: soft, NT, ND, BS normal.  No hepatospenomegaly or mass.  No bruits. EXT: no clubbing, cyanosis, or edema.  Musculoskeletal: no joint swelling, erythema, warmth, or tenderness.  ROM of all joints intact. Skin - no sores or suspicious lesions or rashes or color changes  Pertinent labs:  Lab Results  Component Value Date   TSH 0.76 12/12/2022   Lab Results  Component Value Date   WBC 7.4 01/10/2022   HGB 15.7 01/10/2022   HCT 46.1 01/10/2022   MCV 90.9 01/10/2022   PLT 175.0 01/10/2022   Lab Results  Component Value Date   CREATININE 0.87 12/12/2022   BUN 21 12/12/2022   NA 143 12/12/2022   K 4.9 12/12/2022   CL 105 12/12/2022   CO2 29 12/12/2022   Lab Results  Component Value Date   ALT 9 12/12/2022   AST 13 12/12/2022   ALKPHOS 98 12/12/2022   BILITOT 0.6 12/12/2022   Lab Results  Component Value Date   CHOL 136 12/12/2022   Lab Results  Component Value Date   HDL 47.60 12/12/2022   Lab Results  Component Value Date   LDLCALC 76 12/12/2022    Lab Results  Component Value Date   TRIG 61.0 12/12/2022   Lab Results  Component Value Date   CHOLHDL 3 12/12/2022   Lab Results  Component Value Date   PSA 0.99 12/12/2022   PSA 0.71 01/10/2022   PSA 1.28 04/20/2020   ASSESSMENT AND PLAN:   No problem-specific Assessment & Plan notes found for this encounter.  1 Health maintenance exam: Reviewed age and gender appropriate health maintenance issues (prudent diet, regular exercise, health risks of tobacco and excessive alcohol, use of seatbelts, fire alarms in home, use of sunscreen).  Also reviewed age and gender appropriate health screening as well as vaccine recommendations. Vaccines: ALL UTD. Labs: lipids, TSH Prostate ca screening: no further screening d/t age. Colon ca screening: Has not been able to complete the cologuard or iFOB that have been ordered the last couple of years--->he declines screening at this time.  HLD, doing well on rosuvastatin  10 mg a day. Lipid panel today.  3.  Acquired hypothyroidism. Has been doing well on levothyroxine  150 mcg daily. TSH today.  An After Visit Summary was printed and given to the patient.  FOLLOW UP:  Return in about 6 months (around 12/24/2023) for routine chronic illness f/u.  Signed:  Gerlene Hockey, MD           06/26/2023

## 2023-10-27 ENCOUNTER — Telehealth: Payer: Self-pay | Admitting: Family Medicine

## 2023-10-27 NOTE — Telephone Encounter (Signed)
 Medications sent 06/26/23 with 3 refills

## 2023-10-27 NOTE — Telephone Encounter (Signed)
 Copied from CRM [id number]. Topic: Clinical - Medication Refill >> Oct 27, 2023 10:15 AM Joseph Meyers wrote: Medication: levothyroxine  (SYNTHROID ) 150 MCG tablet  Has the patient contacted their pharmacy? No (Agent: If no, request that the patient contact the pharmacy for the refill. If patient does not wish to contact the pharmacy document the reason why and proceed with request.) (Agent: If yes, when and what did the pharmacy advise?)  This is the patient's preferred pharmacy:  [facility] [phone] - [place], [place] - [address] AT [facility] OF [facility] [address] [place] [address] Phone: [phone] Fax: [phone]  Is this the correct pharmacy for this prescription? Yes If no, delete pharmacy and type the correct one.   Has the prescription been filled recently? No  Is the patient out of the medication? Yes  Has the patient been seen for an appointment in the last year OR does the patient have an upcoming appointment? Yes  Can we respond through MyChart? Yes  Agent: Please be advised that Rx refills may take up to 3 business days. We ask that you follow-up with your pharmacy.

## 2023-11-07 ENCOUNTER — Ambulatory Visit: Payer: Self-pay

## 2023-11-07 NOTE — Telephone Encounter (Signed)
 Chief Complaint: Tick Bite Symptoms: None Frequency: Today Pertinent Negatives: Patient denies fever, redness, rash Disposition: [] ED /[] Urgent Care (no appt availability in office) / [] Appointment(In office/virtual)/ []  [facility]/ [x] Home Care/ [] Refused Recommended Disposition /[] [facility]/ []  Follow-up with PCP Additional Notes: Pt notes he found a tick this AM, was able to remove the tick in its entirety, notes area "looks like a mosquito" bite now. Home care advice given. This RN educated pt on home care, new-worsening symptoms, when to call back/seek emergent care. Pt verbalized understanding and agrees to plan.    Copied from CRM [id number]. Topic: Clinical - Red Word Triage >> Nov 07, 2023  9:34 AM Kita Perish H wrote: [facility] that prompted transfer to Nurse Triage: Pulled tic out of arm has the head and everything, black legged deer tic, injected a little of whatever was in the tic on him. Wants to know if he needs any antibiotics or do anything preventative. Reason for Disposition  Deer tick bite with no complications  Answer Assessment - Initial Assessment Questions 1. ATTACHED:  "Is the tick still on the skin?"  (e.g., yes, no, unsure)     No 4. LOCATION: "Where is the tick bite located?" (e.g., arm, leg)     Today 5. TYPE of TICK: "Is it a wood tick or a deer tick?" (e.g., deer tick, wood tick; unsure)     Black deer tick 6. SIZE of TICK: "How big is the tick?" (e.g., size of poppy seed, apple seed, watermelon seed; unsure) Note: Deer ticks can be the size of a poppy seed (nymph) or an apple seed (adult).       "Very small" 7. ENGORGED: "Did the tick look flat or engorged (full, swollen)?" (e.g., flat, engorged; unsure)     Flat 8. OTHER SYMPTOMS: "Do you have any other symptoms?" (e.g., fever, rash, redness at bite area, red ring around bite)     None  Protocols used: Tick Bite-A-AH

## 2023-12-25 ENCOUNTER — Encounter: Payer: Self-pay | Admitting: Family Medicine

## 2023-12-25 ENCOUNTER — Ambulatory Visit: Admitting: Family Medicine

## 2023-12-25 VITALS — BP 131/70 | HR 65 | Temp 97.4°F | Wt 160.6 lb

## 2023-12-25 DIAGNOSIS — E7800 Pure hypercholesterolemia, unspecified: Secondary | ICD-10-CM

## 2023-12-25 DIAGNOSIS — E039 Hypothyroidism, unspecified: Secondary | ICD-10-CM

## 2023-12-25 LAB — COMPREHENSIVE METABOLIC PANEL WITH GFR
ALT: 12 U/L (ref 0–53)
AST: 15 U/L (ref 0–37)
Albumin: 4.5 g/dL (ref 3.5–5.2)
Alkaline Phosphatase: 90 U/L (ref 39–117)
BUN: 20 mg/dL (ref 6–23)
CO2: 32 meq/L (ref 19–32)
Calcium: 9.3 mg/dL (ref 8.4–10.5)
Chloride: 100 meq/L (ref 96–112)
Creatinine, Ser: 0.91 mg/dL (ref 0.40–1.50)
GFR: 84.84 mL/min (ref >60.00)
Glucose, Bld: 87 mg/dL (ref 70–99)
Potassium: 4.7 meq/L (ref 3.5–5.1)
Sodium: 136 meq/L (ref 135–145)
Total Bilirubin: 0.6 mg/dL (ref 0.2–1.2)
Total Protein: 7.3 g/dL (ref 6.0–8.3)

## 2023-12-25 LAB — LIPID PANEL
Cholesterol: 156 mg/dL (ref 0–200)
HDL: 51.6 mg/dL (ref >39.00)
LDL Cholesterol: 92 mg/dL (ref 0–99)
NonHDL: 104.39
Total CHOL/HDL Ratio: 3
Triglycerides: 61 mg/dL (ref 0.0–149.0)
VLDL: 12.2 mg/dL (ref 0.0–40.0)

## 2023-12-25 LAB — TSH: TSH: 0.76 u[IU]/mL (ref 0.35–5.50)

## 2023-12-25 MED ORDER — ROSUVASTATIN CALCIUM 10 MG PO TABS: 10.0000 mg | ORAL_TABLET | Freq: Every day | ORAL | 3 refills | Status: AC

## 2023-12-25 MED ORDER — LEVOTHYROXINE SODIUM 150 MCG PO TABS: ORAL_TABLET | ORAL | 3 refills | Status: AC

## 2023-12-25 NOTE — Progress Notes (Signed)
 OFFICE VISIT  12/25/2023  CC:  Chief Complaint  Patient presents with   Medical Management of Chronic Issues    Pt is fasting    Patient is a 73 y.o. male who presents for 30-month follow-up hypercholesterolemia and hypothyroidism. A/P as of last visit: 1. HLD, doing well on rosuvastatin  10 mg a day. Lipid panel today.   2  Acquired hypothyroidism. Has been doing well on levothyroxine  150 mcg daily. TSH today.  INTERIM HX: Feeling well. 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   Past Medical History:  Diagnosis Date   Colon cancer screening    Pt has never had colonoscopy due to fear of prep; 2014 consult with Dr. Rennie recommended he get colonoscopy and he agreed but never scheduled it.   COPD (chronic obstructive pulmonary disease) (HCC)    History of genital warts    Inguinal hernia, without mention of obstruction or gangrene    Prominent abdominal aortic pulsation    2022->aortic u/s ordered but pt could not afford.   Pure hypercholesterolemia    he has been hesitant to start statin even though he has purchased it from pharmacy   Seborrheic dermatitis, unspecified    Tobacco use disorder    Unspecified hearing loss    Unspecified hypothyroidism    Varicose veins of leg with pain, left    intermittently painful, wears compression hose.  The varicosities stemmed from vein injury near his knee during a rafting trip in the [place] past    Past Surgical History:  Procedure Laterality Date   EXTERNAL EAR SURGERY     mastoid had nail in it, hit by nail gun   TONSILLECTOMY      Outpatient Medications Prior to Visit  Medication Sig Dispense Refill   clotrimazole -betamethasone  (LOTRISONE ) cream Apply topically 2 (two) times daily. 45 g 1   desonide  (DESOWEN ) 0.05 % lotion Apply 1 Application topically daily as needed. 59 mL 1   levothyroxine  (SYNTHROID ) 150 MCG tablet TAKE 1 TABLET BY MOUTH EVERY DAY. 90 tablet 3   rosuvastatin   (CRESTOR ) 10 MG tablet Take 1 tablet (10 mg total) by mouth daily. 90 tablet 3   No facility-administered medications prior to visit.    Allergies  Allergen Reactions   Clindamycin/Lincomycin Itching   Penicillins     REACTION: rash   Amoxicillin Rash    Review of Systems As per HPI  PE:    12/25/2023    1:25 PM 06/26/2023   10:57 AM 12/12/2022   10:49 AM  Vitals with BMI  Weight 160 lbs 10 oz 167 lbs 6 oz 159 lbs 10 oz  Systolic 131 135 865  Diastolic 70 73 68  Pulse 65 70 63     Physical Exam  Gen: Alert, well appearing.  Patient is oriented to person, place, time, and situation. AFFECT: pleasant, lucid thought and speech. CV: RRR, no m/r/g.   LUNGS: CTA bilat, nonlabored resps, good aeration in all lung fields. EXT: no clubbing or cyanosis.  no edema.    LABS:  Last CBC Lab Results  Component Value Date   WBC 7.4 01/10/2022   HGB 15.7 01/10/2022   HCT 46.1 01/10/2022   MCV 90.9 01/10/2022   RDW 14.0 01/10/2022   PLT 175.0 01/10/2022   Last metabolic panel Lab Results  Component Value Date   GLUCOSE 86 12/12/2022   NA 143 12/12/2022   K 4.9 12/12/2022   CL 105 12/12/2022   CO2 29 12/12/2022  BUN 21 12/12/2022   CREATININE 0.87 12/12/2022   GFR 87.49 12/12/2022   CALCIUM  9.4 12/12/2022   PROT 7.2 12/12/2022   ALBUMIN 4.3 12/12/2022   BILITOT 0.6 12/12/2022   ALKPHOS 98 12/12/2022   AST 13 12/12/2022   ALT 9 12/12/2022   Last lipids Lab Results  Component Value Date   CHOL 153 06/26/2023   HDL 50.50 06/26/2023   LDLCALC 91 06/26/2023   LDLDIRECT 150.7 08/11/2012   TRIG 60.0 06/26/2023   CHOLHDL 3 06/26/2023   Last thyroid  functions Lab Results  Component Value Date   TSH 1.68 06/26/2023   IMPRESSION AND PLAN:  1. HLD, doing well on rosuvastatin  10 mg a day. Lipid panel and metabolic panel today.   2  Acquired hypothyroidism. Has been doing well on levothyroxine  150 mcg daily. TSH today.  An After Visit Summary was printed and  given to the patient.  FOLLOW UP: Return in about 6 months (around 06/26/2024) for annual CPE (fasting).  Signed:  Gerlene Hockey, MD           12/25/2023

## 2023-12-25 NOTE — Addendum Note (Signed)
 Addended by: CANDISE ALEENE DEL on: 12/25/2023 02:04 PM   Modules accepted: Orders

## 2023-12-26 ENCOUNTER — Ambulatory Visit: Payer: Self-pay | Admitting: Family Medicine

## 2024-01-06 ENCOUNTER — Telehealth: Payer: Self-pay

## 2024-01-06 NOTE — Telephone Encounter (Signed)
 Please assist with updating PA for Desonide .  Last PA completed 12/19/22, expiration 12/18/23

## 2024-01-07 ENCOUNTER — Other Ambulatory Visit (HOSPITAL_COMMUNITY): Payer: Self-pay

## 2024-01-07 ENCOUNTER — Telehealth: Payer: Self-pay

## 2024-01-07 NOTE — Telephone Encounter (Signed)
 Pharmacy Patient Advocate Encounter   Received notification from CoverMyMeds that prior authorization for Desonide  0.05% lotion is due for renewal.   Insurance verification completed.   The patient is insured through [employer].  Action: PA required; PA submitted to above mentioned insurance via CoverMyMeds Key/confirmation #/EOC [id number] Status is pending

## 2024-01-07 NOTE — Telephone Encounter (Signed)
 Pharmacy Patient Advocate Encounter  Received notification from [facility] that Prior Authorization for Desonide  0.05% lotion  has been APPROVED from 01/07/2024 to 01/06/2025   [facility]

## 2024-01-07 NOTE — Telephone Encounter (Signed)
 FYI reviewed

## 2024-07-01 ENCOUNTER — Ambulatory Visit: Admitting: Family Medicine

## 2024-07-01 ENCOUNTER — Encounter: Payer: Self-pay | Admitting: Family Medicine

## 2024-07-01 VITALS — BP 133/80 | HR 62 | Temp 97.7°F | Ht 72.0 in | Wt 161.8 lb

## 2024-07-01 DIAGNOSIS — E039 Hypothyroidism, unspecified: Secondary | ICD-10-CM | POA: Diagnosis not present

## 2024-07-01 DIAGNOSIS — E7800 Pure hypercholesterolemia, unspecified: Secondary | ICD-10-CM | POA: Diagnosis not present

## 2024-07-01 LAB — LIPID PANEL
Cholesterol: 144 mg/dL (ref 28–200)
HDL: 52.2 mg/dL
LDL Cholesterol: 78 mg/dL (ref 10–99)
NonHDL: 91.76
Total CHOL/HDL Ratio: 3
Triglycerides: 69 mg/dL (ref 10.0–149.0)
VLDL: 13.8 mg/dL (ref 0.0–40.0)

## 2024-07-01 LAB — TSH: TSH: 0.38 u[IU]/mL (ref 0.35–5.50)

## 2024-07-01 MED ORDER — CLOTRIMAZOLE-BETAMETHASONE 1-0.05 % EX CREA: TOPICAL_CREAM | Freq: Two times a day (BID) | CUTANEOUS | 1 refills | Status: AC

## 2024-07-01 MED ORDER — DESONIDE 0.05 % EX LOTN: 1.0000 | TOPICAL_LOTION | Freq: Every day | CUTANEOUS | 1 refills | Status: AC | PRN

## 2024-07-01 NOTE — Progress Notes (Signed)
 "     Office Note 07/01/2024  CC:  Chief Complaint  Patient presents with   Annual Exam    Pt is fasting    HPI:  Patient is a 74 y.o. male who is here for 72-month follow-up hyperlipidemia and hypothyroidism.  All was stable last visit 6 months ago, no changes made. Joseph Meyers feels well.  Past Medical History:  Diagnosis Date   Colon cancer screening    Pt has never had colonoscopy due to fear of prep; 2014 consult with Dr. Rennie recommended he get colonoscopy and he agreed but never scheduled it.   COPD (chronic obstructive pulmonary disease) (HCC)    History of genital warts    Inguinal hernia, without mention of obstruction or gangrene    Prominent abdominal aortic pulsation    2022->aortic u/s ordered but pt could not afford.   Pure hypercholesterolemia    he has been hesitant to start statin even though he has purchased it from pharmacy   Seborrheic dermatitis, unspecified    Tobacco use disorder    Unspecified hearing loss    Unspecified hypothyroidism    Varicose veins of leg with pain, left    intermittently painful, wears compression hose.  The varicosities stemmed from vein injury near his knee during a rafting trip in the [place] past    Past Surgical History:  Procedure Laterality Date   EXTERNAL EAR SURGERY     mastoid had nail in it, hit by nail gun   TONSILLECTOMY      Family History  Problem Relation Age of Onset   Thyroid  disease Father    Bipolar disorder Father    Leukemia Mother        CLL    Social History   Socioeconomic History   Marital status: Married    Spouse name: karon   Number of children: 2   Years of education: Not on file   Highest education level: GED or equivalent  Occupational History   Occupation: Engineer, manufacturing systems    Comment: parts delivery  Tobacco Use   Smoking status: Every Day    Current packs/day: 0.00    Types: Cigarettes, Cigars    Last attempt to quit: 11/23/2012    Years since quitting: 11.6   Smokeless  tobacco: Former    Types: Chew   Tobacco comments:    1 ppd  Vaping Use   Vaping status: Never Used  Substance and Sexual Activity   Alcohol use: No   Drug use: No   Sexual activity: Not on file  Other Topics Concern   Not on file  Social History Narrative   Married, 1 daughter and two sons.   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   Tob: cigars as of 2019.     Cigarettes: 50 pack-yr hx, quit 2013.   Alcohol: none   Social Drivers of Health   Tobacco Use: High Risk (07/01/2024)   Patient History    Smoking Tobacco Use: Every Day    Smokeless Tobacco Use: Former    Passive Exposure: Not on Actuary Strain: Medium Risk (12/21/2023)   Overall Financial Resource Strain (CARDIA)    Difficulty of Paying Living Expenses: Somewhat hard  Food Insecurity: No Food Insecurity (12/21/2023)   Epic    Worried About Programme Researcher, Broadcasting/film/video in the Last Year: Never true    Ran Out of Food in the Last Year: Never true  Transportation Needs: No Transportation  Needs (12/21/2023)   Epic    Lack of Transportation (Medical): No    Lack of Transportation (Non-Medical): No  Physical Activity: Sufficiently Active (12/21/2023)   Exercise Vital Sign    Days of Exercise per Week: 7 days    Minutes of Exercise per Session: 150+ min  Stress: No Stress Concern Present (12/21/2023)   [employer] of Occupational Health - Occupational Stress Questionnaire    Feeling of Stress: Only a little  Social Connections: Moderately Integrated (12/21/2023)   Social Connection and Isolation Panel    Frequency of Communication with Friends and Family: More than three times a week    Frequency of Social Gatherings with Friends and Family: Twice a week    Attends Religious Services: More than 4 times per year    Active Member of [employer] or Organizations: No    Attends Engineer, Structural: Not on file    Marital Status: Married  Catering Manager Violence: Not on file   Depression (PHQ2-9): Low Risk (07/01/2024)   Depression (PHQ2-9)    PHQ-2 Score: 0  Alcohol Screen: Low Risk (06/22/2023)   Alcohol Screen    Last Alcohol Screening Score (AUDIT): 1  Housing: Low Risk (12/21/2023)   Epic    Unable to Pay for Housing in the Last Year: No    Number of Times Moved in the Last Year: 0    Homeless in the Last Year: No  Utilities: Not on file  Health Literacy: Not on file    Outpatient Medications Prior to Visit  Medication Sig Dispense Refill   levothyroxine  (SYNTHROID ) 150 MCG tablet TAKE 1 TABLET BY MOUTH EVERY DAY. 90 tablet 3   rosuvastatin  (CRESTOR ) 10 MG tablet Take 1 tablet (10 mg total) by mouth daily. 90 tablet 3   clotrimazole -betamethasone  (LOTRISONE ) cream Apply topically 2 (two) times daily. 45 g 1   desonide  (DESOWEN ) 0.05 % lotion Apply 1 Application topically daily as needed. 59 mL 1   No facility-administered medications prior to visit.    Allergies[1]   PE;    07/01/2024   10:49 AM 12/25/2023    1:25 PM 06/26/2023   10:57 AM  Vitals with BMI  Height 6' 0    Weight 161 lbs 13 oz 160 lbs 10 oz 167 lbs 6 oz  BMI 21.94    Systolic 133 131 864  Diastolic 80 70 73  Pulse 62 65 70     Gen: Alert, well appearing.  Patient is oriented to person, place, time, and situation. AFFECT: pleasant, lucid thought and speech. CV: RRR, no m/r/g.   LUNGS: CTA bilat, nonlabored resps, good aeration in all lung fields. EXT: no clubbing or cyanosis.  no edema.   Pertinent labs:  Lab Results  Component Value Date   TSH 0.76 12/25/2023   Lab Results  Component Value Date   WBC 7.4 01/10/2022   HGB 15.7 01/10/2022   HCT 46.1 01/10/2022   MCV 90.9 01/10/2022   PLT 175.0 01/10/2022   Lab Results  Component Value Date   CREATININE 0.91 12/25/2023   BUN 20 12/25/2023   NA 136 12/25/2023   K 4.7 12/25/2023   CL 100 12/25/2023   CO2 32 12/25/2023   Lab Results  Component Value Date   ALT 12 12/25/2023   AST 15 12/25/2023   ALKPHOS 90  12/25/2023   BILITOT 0.6 12/25/2023   Lab Results  Component Value Date   CHOL 156 12/25/2023   Lab Results  Component Value  Date   HDL 51.60 12/25/2023   Lab Results  Component Value Date   LDLCALC 92 12/25/2023   Lab Results  Component Value Date   TRIG 61.0 12/25/2023   Lab Results  Component Value Date   CHOLHDL 3 12/25/2023   Lab Results  Component Value Date   PSA 0.99 12/12/2022   PSA 0.71 01/10/2022   PSA 1.28 04/20/2020   ASSESSMENT AND PLAN:   #1 acquired hypothyroidism. Continue levothyroxine  150 mcg daily. Check TSH today.  2.  Hypercholesterolemia, doing well on rosuvastatin  10 mg. Monitor lipid panel today.  #3 preventative health:  Vaccines: Tdap-->declined.  Shingrix->declined. Labs: lipids, TSH Prostate ca screening: no further screening d/t age. Colon ca screening: Has not been able to complete the cologuard or iFOB that have been ordered the last couple of years--->he declines screening at this time.  An After Visit Summary was printed and given to the patient.  FOLLOW UP:  Return in about 6 months (around 12/29/2024) for annual CPE (fasting).  Signed:  Gerlene Hockey, MD           07/01/2024     [1]  Allergies Allergen Reactions   Clindamycin/Lincomycin Itching   Penicillins     REACTION: rash   Amoxicillin Rash   "

## 2024-07-02 ENCOUNTER — Other Ambulatory Visit (HOSPITAL_COMMUNITY): Payer: Self-pay

## 2024-07-02 ENCOUNTER — Telehealth: Payer: Self-pay

## 2024-07-02 NOTE — Telephone Encounter (Signed)
 Pharmacy Patient Advocate Encounter   Received notification from [facility] that prior authorization for Desonide  0.05% lotion is required/requested.   Insurance verification completed.   The patient is insured through [employer].   Per test claim: PA required; PA submitted to above mentioned insurance via Latent Key/confirmation #/EOC [id number] Status is pending

## 2024-07-04 ENCOUNTER — Ambulatory Visit: Payer: Self-pay | Admitting: Family Medicine

## 2024-12-30 ENCOUNTER — Encounter: Admitting: Family Medicine
